# Patient Record
Sex: Female | Born: 1940 | Race: White | Hispanic: No | Marital: Married
Health system: Southern US, Community
[De-identification: ages and names within clinical notes are randomized; demographics above are authoritative.]

---

## 2004-04-02 ENCOUNTER — Ambulatory Visit: Payer: Self-pay | Admitting: Internal Medicine

## 2005-04-16 ENCOUNTER — Ambulatory Visit: Payer: Self-pay | Admitting: Internal Medicine

## 2005-06-29 ENCOUNTER — Ambulatory Visit: Payer: Self-pay | Admitting: Gastroenterology

## 2005-07-21 ENCOUNTER — Ambulatory Visit: Payer: Self-pay | Admitting: Ophthalmology

## 2006-06-03 ENCOUNTER — Ambulatory Visit: Payer: Self-pay | Admitting: Internal Medicine

## 2007-08-16 ENCOUNTER — Ambulatory Visit: Payer: Self-pay | Admitting: Internal Medicine

## 2007-08-18 ENCOUNTER — Ambulatory Visit: Payer: Self-pay | Admitting: Internal Medicine

## 2008-03-20 ENCOUNTER — Ambulatory Visit: Payer: Self-pay | Admitting: Internal Medicine

## 2008-04-03 ENCOUNTER — Ambulatory Visit: Payer: Self-pay | Admitting: Internal Medicine

## 2008-08-17 ENCOUNTER — Ambulatory Visit: Payer: Self-pay | Admitting: Internal Medicine

## 2008-10-08 ENCOUNTER — Ambulatory Visit: Payer: Self-pay | Admitting: Internal Medicine

## 2009-05-02 ENCOUNTER — Ambulatory Visit: Payer: Self-pay | Admitting: Internal Medicine

## 2009-06-24 ENCOUNTER — Ambulatory Visit: Payer: Self-pay | Admitting: Gastroenterology

## 2009-07-01 ENCOUNTER — Ambulatory Visit: Payer: Self-pay | Admitting: Gastroenterology

## 2009-07-24 ENCOUNTER — Ambulatory Visit: Payer: Self-pay | Admitting: Gastroenterology

## 2009-08-14 ENCOUNTER — Other Ambulatory Visit: Payer: Self-pay | Admitting: Gastroenterology

## 2009-08-22 ENCOUNTER — Ambulatory Visit: Payer: Self-pay | Admitting: Internal Medicine

## 2010-06-18 ENCOUNTER — Ambulatory Visit: Payer: Self-pay | Admitting: Ophthalmology

## 2010-07-07 ENCOUNTER — Ambulatory Visit: Payer: Self-pay | Admitting: Ophthalmology

## 2010-10-28 ENCOUNTER — Ambulatory Visit: Payer: Self-pay | Admitting: Internal Medicine

## 2011-07-17 ENCOUNTER — Ambulatory Visit: Payer: Self-pay | Admitting: Gastroenterology

## 2011-08-11 ENCOUNTER — Ambulatory Visit: Payer: Self-pay | Admitting: Gastroenterology

## 2011-11-03 ENCOUNTER — Ambulatory Visit: Payer: Self-pay | Admitting: Internal Medicine

## 2011-12-11 ENCOUNTER — Emergency Department: Payer: Self-pay | Admitting: Emergency Medicine

## 2011-12-11 LAB — BASIC METABOLIC PANEL
Anion Gap: 8 (ref 7–16)
BUN: 11 mg/dL (ref 7–18)
Calcium, Total: 9.3 mg/dL (ref 8.5–10.1)
Chloride: 102 mmol/L (ref 98–107)
Creatinine: 0.73 mg/dL (ref 0.60–1.30)
EGFR (African American): >60
EGFR (Non-African Amer.): >60
Osmolality: 275 (ref 275–301)
Sodium: 137 mmol/L (ref 136–145)

## 2011-12-11 LAB — TROPONIN I: Troponin-I: <0.02 ng/mL

## 2011-12-11 LAB — CBC
HCT: 34.2 % — ABNORMAL LOW (ref 35.0–47.0)
MCH: 26 pg (ref 26.0–34.0)
MCHC: 32.8 g/dL (ref 32.0–36.0)
RBC: 4.31 10*6/uL (ref 3.80–5.20)
WBC: 13.7 10*3/uL — ABNORMAL HIGH (ref 3.6–11.0)

## 2011-12-22 ENCOUNTER — Ambulatory Visit: Payer: Self-pay | Admitting: Internal Medicine

## 2011-12-24 ENCOUNTER — Emergency Department: Payer: Self-pay | Admitting: Emergency Medicine

## 2011-12-25 LAB — BASIC METABOLIC PANEL
Anion Gap: 7 (ref 7–16)
BUN: 11 mg/dL (ref 7–18)
Creatinine: 0.68 mg/dL (ref 0.60–1.30)
EGFR (African American): >60
EGFR (Non-African Amer.): >60
Osmolality: 277 (ref 275–301)

## 2011-12-25 LAB — HEPATIC FUNCTION PANEL A (ARMC)
Alkaline Phosphatase: 71 U/L (ref 50–136)
Bilirubin, Direct: <0.1 mg/dL (ref 0.00–0.20)
Bilirubin,Total: 0.2 mg/dL (ref 0.2–1.0)
SGOT(AST): 13 U/L — ABNORMAL LOW (ref 15–37)
SGPT (ALT): 17 U/L (ref 12–78)
Total Protein: 7 g/dL (ref 6.4–8.2)

## 2011-12-25 LAB — CBC
HCT: 31.2 % — ABNORMAL LOW (ref 35.0–47.0)
HGB: 10.1 g/dL — ABNORMAL LOW (ref 12.0–16.0)
MCV: 79 fL — ABNORMAL LOW (ref 80–100)
Platelet: 360 10*3/uL (ref 150–440)
RDW: 14.8 % — ABNORMAL HIGH (ref 11.5–14.5)
WBC: 9.8 10*3/uL (ref 3.6–11.0)

## 2011-12-30 ENCOUNTER — Ambulatory Visit: Payer: Self-pay | Admitting: Specialist

## 2012-01-06 ENCOUNTER — Ambulatory Visit: Payer: Self-pay | Admitting: Hematology and Oncology

## 2012-01-06 LAB — COMPREHENSIVE METABOLIC PANEL
Anion Gap: 8 (ref 7–16)
Bilirubin,Total: 0.3 mg/dL (ref 0.2–1.0)
Calcium, Total: 9.2 mg/dL (ref 8.5–10.1)
Chloride: 103 mmol/L (ref 98–107)
Co2: 28 mmol/L (ref 21–32)
Creatinine: 0.66 mg/dL (ref 0.60–1.30)
EGFR (African American): >60
EGFR (Non-African Amer.): >60
Potassium: 4.2 mmol/L (ref 3.5–5.1)
SGOT(AST): 16 U/L (ref 15–37)
SGPT (ALT): 15 U/L (ref 12–78)

## 2012-01-06 LAB — CBC CANCER CENTER
Basophil %: 0.3 %
Eosinophil %: 0.3 %
HCT: 33.2 % — ABNORMAL LOW (ref 35.0–47.0)
HGB: 10.7 g/dL — ABNORMAL LOW (ref 12.0–16.0)
Lymphocyte %: 8.5 %
MCH: 25.1 pg — ABNORMAL LOW (ref 26.0–34.0)
MCHC: 32.2 g/dL (ref 32.0–36.0)
Monocyte #: 0.6 x10 3/mm (ref 0.2–0.9)
Neutrophil #: 9.1 x10 3/mm — ABNORMAL HIGH (ref 1.4–6.5)
Neutrophil %: 85.3 %
Platelet: 516 x10 3/mm — ABNORMAL HIGH (ref 150–440)
RDW: 14.6 % — ABNORMAL HIGH (ref 11.5–14.5)

## 2012-01-06 LAB — APTT: Activated PTT: 33 secs (ref 23.6–35.9)

## 2012-01-15 ENCOUNTER — Ambulatory Visit: Payer: Self-pay | Admitting: Hematology and Oncology

## 2012-01-21 LAB — COMPREHENSIVE METABOLIC PANEL
Albumin: 2.7 g/dL — ABNORMAL LOW (ref 3.4–5.0)
Alkaline Phosphatase: 75 U/L (ref 50–136)
Anion Gap: 8 (ref 7–16)
Bilirubin,Total: 0.4 mg/dL (ref 0.2–1.0)
Calcium, Total: 9.7 mg/dL (ref 8.5–10.1)
Co2: 28 mmol/L (ref 21–32)
Glucose: 134 mg/dL — ABNORMAL HIGH (ref 65–99)
Osmolality: 268 (ref 275–301)
SGPT (ALT): 24 U/L (ref 12–78)
Sodium: 133 mmol/L — ABNORMAL LOW (ref 136–145)
Total Protein: 7.1 g/dL (ref 6.4–8.2)

## 2012-01-21 LAB — CBC CANCER CENTER
Basophil #: 0.1 x10 3/mm (ref 0.0–0.1)
Basophil %: 0.6 %
Eosinophil #: 0 x10 3/mm (ref 0.0–0.7)
Eosinophil %: 0.1 %
HGB: 10.4 g/dL — ABNORMAL LOW (ref 12.0–16.0)
Lymphocyte %: 7.5 %
MCH: 24.1 pg — ABNORMAL LOW (ref 26.0–34.0)
MCHC: 31.6 g/dL — ABNORMAL LOW (ref 32.0–36.0)
MCV: 76 fL — ABNORMAL LOW (ref 80–100)
Monocyte %: 8.2 %
Neutrophil %: 83.6 %
Platelet: 513 x10 3/mm — ABNORMAL HIGH (ref 150–440)
RBC: 4.32 10*6/uL (ref 3.80–5.20)
RDW: 15.3 % — ABNORMAL HIGH (ref 11.5–14.5)

## 2012-01-28 LAB — COMPREHENSIVE METABOLIC PANEL
Alkaline Phosphatase: 79 U/L (ref 50–136)
Bilirubin,Total: 0.5 mg/dL (ref 0.2–1.0)
Chloride: 90 mmol/L — ABNORMAL LOW (ref 98–107)
Co2: 22 mmol/L (ref 21–32)
Creatinine: 0.91 mg/dL (ref 0.60–1.30)
EGFR (African American): >60
EGFR (Non-African Amer.): >60
Glucose: 116 mg/dL — ABNORMAL HIGH (ref 65–99)
Potassium: 4.6 mmol/L (ref 3.5–5.1)
SGOT(AST): 24 U/L (ref 15–37)
SGPT (ALT): 47 U/L (ref 12–78)

## 2012-01-28 LAB — CBC CANCER CENTER
Basophil %: 0.3 %
Eosinophil #: 0 x10 3/mm (ref 0.0–0.7)
HCT: 33.7 % — ABNORMAL LOW (ref 35.0–47.0)
HGB: 10.4 g/dL — ABNORMAL LOW (ref 12.0–16.0)
Lymphocyte %: 17.9 %
MCHC: 30.9 g/dL — ABNORMAL LOW (ref 32.0–36.0)
MCV: 77 fL — ABNORMAL LOW (ref 80–100)
Monocyte #: 0.5 x10 3/mm (ref 0.2–0.9)
Monocyte %: 29.6 %
Neutrophil %: 50.2 %
RBC: 4.4 10*6/uL (ref 3.80–5.20)
RDW: 15.2 % — ABNORMAL HIGH (ref 11.5–14.5)
WBC: 1.6 x10 3/mm — CL (ref 3.6–11.0)

## 2012-01-29 ENCOUNTER — Ambulatory Visit: Payer: Self-pay | Admitting: Hematology and Oncology

## 2012-01-30 LAB — CBC
HGB: 10.2 g/dL — ABNORMAL LOW (ref 12.0–16.0)
MCH: 24.3 pg — ABNORMAL LOW (ref 26.0–34.0)
MCHC: 32.2 g/dL (ref 32.0–36.0)
MCV: 76 fL — ABNORMAL LOW (ref 80–100)
RDW: 15 % — ABNORMAL HIGH (ref 11.5–14.5)
WBC: 16.2 10*3/uL — ABNORMAL HIGH (ref 3.6–11.0)

## 2012-01-30 LAB — CK TOTAL AND CKMB (NOT AT ARMC)
CK, Total: 25 U/L (ref 21–215)
CK-MB: <0.5 ng/mL — ABNORMAL LOW (ref 0.5–3.6)

## 2012-01-30 LAB — COMPREHENSIVE METABOLIC PANEL
Anion Gap: 12 (ref 7–16)
BUN: 18 mg/dL (ref 7–18)
Bilirubin,Total: 0.5 mg/dL (ref 0.2–1.0)
Calcium, Total: 8.6 mg/dL (ref 8.5–10.1)
Chloride: 96 mmol/L — ABNORMAL LOW (ref 98–107)
Creatinine: 0.9 mg/dL (ref 0.60–1.30)
EGFR (African American): >60
Glucose: 160 mg/dL — ABNORMAL HIGH (ref 65–99)
Potassium: 4.3 mmol/L (ref 3.5–5.1)
SGPT (ALT): 28 U/L (ref 12–78)
Total Protein: 6.2 g/dL — ABNORMAL LOW (ref 6.4–8.2)

## 2012-01-31 ENCOUNTER — Inpatient Hospital Stay: Payer: Self-pay | Admitting: Internal Medicine

## 2012-01-31 LAB — URINALYSIS, COMPLETE
Bilirubin,UR: NEGATIVE
Glucose,UR: 50 mg/dL (ref 0–75)
Ph: 5 (ref 4.5–8.0)
RBC,UR: 2 /HPF (ref 0–5)
Specific Gravity: 1.025 (ref 1.003–1.030)
Squamous Epithelial: 3

## 2012-02-01 ENCOUNTER — Ambulatory Visit: Payer: Self-pay | Admitting: Hematology and Oncology

## 2012-02-01 LAB — CBC WITH DIFFERENTIAL/PLATELET
Bands: 6 %
Comment - H1-Com4: NORMAL
HGB: 7.8 g/dL — ABNORMAL LOW (ref 12.0–16.0)
Lymphocytes: 6 %
MCH: 24.7 pg — ABNORMAL LOW (ref 26.0–34.0)
MCV: 76 fL — ABNORMAL LOW (ref 80–100)
Metamyelocyte: 1 %
Monocytes: 5 %
Platelet: 200 10*3/uL (ref 150–440)
Segmented Neutrophils: 81 %

## 2012-02-01 LAB — BASIC METABOLIC PANEL
Anion Gap: 10 (ref 7–16)
BUN: 9 mg/dL (ref 7–18)
Chloride: 102 mmol/L (ref 98–107)
Creatinine: 0.73 mg/dL (ref 0.60–1.30)
Potassium: 4 mmol/L (ref 3.5–5.1)
Sodium: 134 mmol/L — ABNORMAL LOW (ref 136–145)

## 2012-02-02 LAB — STOOL CULTURE

## 2012-02-04 LAB — CBC CANCER CENTER
Basophil #: 0 x10 3/mm (ref 0.0–0.1)
Eosinophil #: 0 x10 3/mm (ref 0.0–0.7)
HCT: 28.5 % — ABNORMAL LOW (ref 35.0–47.0)
Lymphocyte #: 0.6 x10 3/mm — ABNORMAL LOW (ref 1.0–3.6)
Lymphocyte %: 4.3 %
MCH: 24.1 pg — ABNORMAL LOW (ref 26.0–34.0)
MCHC: 30.9 g/dL — ABNORMAL LOW (ref 32.0–36.0)
Monocyte #: 0.6 x10 3/mm (ref 0.2–0.9)
Monocyte %: 4.2 %
Neutrophil #: 12.3 x10 3/mm — ABNORMAL HIGH (ref 1.4–6.5)
Neutrophil %: 91.4 %
Platelet: 249 x10 3/mm (ref 150–440)
RDW: 15.7 % — ABNORMAL HIGH (ref 11.5–14.5)
WBC: 13.5 x10 3/mm — ABNORMAL HIGH (ref 3.6–11.0)

## 2012-02-04 LAB — COMPREHENSIVE METABOLIC PANEL
Alkaline Phosphatase: 93 U/L (ref 50–136)
Anion Gap: 8 (ref 7–16)
BUN: 8 mg/dL (ref 7–18)
Bilirubin,Total: 0.3 mg/dL (ref 0.2–1.0)
Chloride: 104 mmol/L (ref 98–107)
Co2: 28 mmol/L (ref 21–32)
Creatinine: 0.63 mg/dL (ref 0.60–1.30)
EGFR (Non-African Amer.): >60
Glucose: 112 mg/dL — ABNORMAL HIGH (ref 65–99)
SGOT(AST): 22 U/L (ref 15–37)
SGPT (ALT): 25 U/L (ref 12–78)
Total Protein: 5.6 g/dL — ABNORMAL LOW (ref 6.4–8.2)

## 2012-02-05 LAB — CULTURE, BLOOD (SINGLE)

## 2012-02-05 LAB — PATHOLOGY REPORT

## 2012-02-11 LAB — CBC CANCER CENTER
Basophil #: 0 x10 3/mm (ref 0.0–0.1)
Eosinophil #: 0 x10 3/mm (ref 0.0–0.7)
HCT: 31.4 % — ABNORMAL LOW (ref 35.0–47.0)
Lymphocyte #: 0.3 x10 3/mm — ABNORMAL LOW (ref 1.0–3.6)
MCH: 24.7 pg — ABNORMAL LOW (ref 26.0–34.0)
MCHC: 31 g/dL — ABNORMAL LOW (ref 32.0–36.0)
MCV: 80 fL (ref 80–100)
Monocyte %: 7.7 %
Neutrophil #: 5.1 x10 3/mm (ref 1.4–6.5)
RBC: 3.94 10*6/uL (ref 3.80–5.20)
RDW: 16.8 % — ABNORMAL HIGH (ref 11.5–14.5)
WBC: 5.9 x10 3/mm (ref 3.6–11.0)

## 2012-02-11 LAB — COMPREHENSIVE METABOLIC PANEL
Albumin: 2.7 g/dL — ABNORMAL LOW (ref 3.4–5.0)
Alkaline Phosphatase: 76 U/L (ref 50–136)
Anion Gap: 9 (ref 7–16)
Calcium, Total: 9.2 mg/dL (ref 8.5–10.1)
Co2: 26 mmol/L (ref 21–32)
EGFR (Non-African Amer.): >60
Glucose: 172 mg/dL — ABNORMAL HIGH (ref 65–99)
Osmolality: 277 (ref 275–301)
Potassium: 4 mmol/L (ref 3.5–5.1)
SGOT(AST): 12 U/L — ABNORMAL LOW (ref 15–37)

## 2012-02-18 ENCOUNTER — Emergency Department: Payer: Self-pay | Admitting: Emergency Medicine

## 2012-02-18 LAB — URINALYSIS, COMPLETE
Granular Cast: 7
Hyaline Cast: 3
Leukocyte Esterase: NEGATIVE
Ph: 5 (ref 4.5–8.0)
Protein: 100
RBC,UR: 4 /HPF (ref 0–5)
Specific Gravity: 1.02 (ref 1.003–1.030)
Squamous Epithelial: 4
WBC UR: 13 /HPF (ref 0–5)

## 2012-02-18 LAB — CBC
HCT: 27.3 % — ABNORMAL LOW (ref 35.0–47.0)
MCV: 77 fL — ABNORMAL LOW (ref 80–100)
Platelet: 92 10*3/uL — ABNORMAL LOW (ref 150–440)
RDW: 17.7 % — ABNORMAL HIGH (ref 11.5–14.5)
WBC: 0.9 10*3/uL — CL (ref 3.6–11.0)

## 2012-02-18 LAB — DIFFERENTIAL
Bands: 2 %
Lymphocytes: 6 %
Segmented Neutrophils: 15 %

## 2012-02-18 LAB — COMPREHENSIVE METABOLIC PANEL
Albumin: 2.6 g/dL — ABNORMAL LOW (ref 3.4–5.0)
Alkaline Phosphatase: 82 U/L (ref 50–136)
Anion Gap: 8 (ref 7–16)
BUN: 14 mg/dL (ref 7–18)
Calcium, Total: 8.2 mg/dL — ABNORMAL LOW (ref 8.5–10.1)
Co2: 24 mmol/L (ref 21–32)
Creatinine: 0.56 mg/dL — ABNORMAL LOW (ref 0.60–1.30)
EGFR (African American): >60
EGFR (Non-African Amer.): >60
Glucose: 90 mg/dL (ref 65–99)
Potassium: 4.1 mmol/L (ref 3.5–5.1)
SGOT(AST): 19 U/L (ref 15–37)

## 2012-02-18 LAB — PHOSPHORUS: Phosphorus: 2.5 mg/dL (ref 2.5–4.9)

## 2012-02-18 LAB — MAGNESIUM: Magnesium: 1.3 mg/dL — ABNORMAL LOW

## 2012-02-23 LAB — CBC CANCER CENTER
Basophil #: 0 x10 3/mm (ref 0.0–0.1)
Eosinophil #: 0 x10 3/mm (ref 0.0–0.7)
HGB: 9 g/dL — ABNORMAL LOW (ref 12.0–16.0)
Lymphocyte #: 0.6 x10 3/mm — ABNORMAL LOW (ref 1.0–3.6)
MCH: 25.3 pg — ABNORMAL LOW (ref 26.0–34.0)
Neutrophil #: 3.4 x10 3/mm (ref 1.4–6.5)
Neutrophil %: 70.8 %
Platelet: 147 x10 3/mm — ABNORMAL LOW (ref 150–440)
RBC: 3.54 10*6/uL — ABNORMAL LOW (ref 3.80–5.20)
WBC: 4.8 x10 3/mm (ref 3.6–11.0)

## 2012-02-23 LAB — COMPREHENSIVE METABOLIC PANEL
BUN: 11 mg/dL (ref 7–18)
Calcium, Total: 7.8 mg/dL — ABNORMAL LOW (ref 8.5–10.1)
Chloride: 94 mmol/L — ABNORMAL LOW (ref 98–107)
Co2: 25 mmol/L (ref 21–32)
Creatinine: 0.71 mg/dL (ref 0.60–1.30)
EGFR (African American): >60
EGFR (Non-African Amer.): >60
Osmolality: 264 (ref 275–301)
Potassium: 3.8 mmol/L (ref 3.5–5.1)
SGOT(AST): 12 U/L — ABNORMAL LOW (ref 15–37)
SGPT (ALT): 24 U/L (ref 12–78)

## 2012-02-23 LAB — MAGNESIUM: Magnesium: 2 mg/dL

## 2012-02-24 LAB — CULTURE, BLOOD (SINGLE)

## 2012-02-28 ENCOUNTER — Ambulatory Visit: Payer: Self-pay | Admitting: Hematology and Oncology

## 2012-02-29 ENCOUNTER — Ambulatory Visit: Payer: Self-pay | Admitting: Specialist

## 2012-03-02 ENCOUNTER — Inpatient Hospital Stay: Payer: Self-pay | Admitting: Hematology and Oncology

## 2012-03-02 LAB — BASIC METABOLIC PANEL
Calcium, Total: 8.2 mg/dL — ABNORMAL LOW (ref 8.5–10.1)
Chloride: 96 mmol/L — ABNORMAL LOW (ref 98–107)
Creatinine: 0.85 mg/dL (ref 0.60–1.30)
EGFR (African American): >60
EGFR (Non-African Amer.): >60
Glucose: 105 mg/dL — ABNORMAL HIGH (ref 65–99)
Osmolality: 269 (ref 275–301)
Potassium: 4.4 mmol/L (ref 3.5–5.1)

## 2012-03-02 LAB — CBC CANCER CENTER
Basophil %: 0.3 %
Eosinophil #: 0 x10 3/mm (ref 0.0–0.7)
Eosinophil %: 0.1 %
HGB: 9.3 g/dL — ABNORMAL LOW (ref 12.0–16.0)
Lymphocyte %: 12.3 %
MCH: 25.4 pg — ABNORMAL LOW (ref 26.0–34.0)
MCHC: 33 g/dL (ref 32.0–36.0)
Monocyte #: 1.1 x10 3/mm — ABNORMAL HIGH (ref 0.2–0.9)
Monocyte %: 18 %
Neutrophil %: 69.3 %
Platelet: 780 x10 3/mm — ABNORMAL HIGH (ref 150–440)
RBC: 3.65 10*6/uL — ABNORMAL LOW (ref 3.80–5.20)
RDW: 18.5 % — ABNORMAL HIGH (ref 11.5–14.5)
WBC: 6.1 x10 3/mm (ref 3.6–11.0)

## 2012-03-03 LAB — CBC WITH DIFFERENTIAL/PLATELET
Bands: 3 %
HCT: 22.8 % — ABNORMAL LOW (ref 35.0–47.0)
HGB: 7.3 g/dL — ABNORMAL LOW (ref 12.0–16.0)
MCH: 24.7 pg — ABNORMAL LOW (ref 26.0–34.0)
MCHC: 32.1 g/dL (ref 32.0–36.0)
MCV: 77 fL — ABNORMAL LOW (ref 80–100)
Metamyelocyte: 6 %
Platelet: 523 10*3/uL — ABNORMAL HIGH (ref 150–440)
RDW: 18.6 % — ABNORMAL HIGH (ref 11.5–14.5)
Segmented Neutrophils: 74 %

## 2012-03-03 LAB — BASIC METABOLIC PANEL
BUN: 9 mg/dL (ref 7–18)
EGFR (African American): >60
Glucose: 117 mg/dL — ABNORMAL HIGH (ref 65–99)
Potassium: 3.9 mmol/L (ref 3.5–5.1)

## 2012-03-03 LAB — CREATININE, URINE, 24 HOUR: Creatinine, 24 Hr Urine: 499 mg/24hr — ABNORMAL LOW (ref 600–1000)

## 2012-03-03 LAB — MAGNESIUM: Magnesium: 1.9 mg/dL

## 2012-03-03 LAB — TRIGLYCERIDES: Triglycerides: 68 mg/dL (ref 0–200)

## 2012-03-04 LAB — CALCIUM: Calcium, Total: 7.8 mg/dL — ABNORMAL LOW (ref 8.5–10.1)

## 2012-03-04 LAB — POTASSIUM: Potassium: 4.1 mmol/L (ref 3.5–5.1)

## 2012-03-04 LAB — PHOSPHORUS: Phosphorus: 2.5 mg/dL (ref 2.5–4.9)

## 2012-03-04 LAB — MAGNESIUM: Magnesium: 1.9 mg/dL

## 2012-03-04 LAB — SODIUM: Sodium: 136 mmol/L (ref 136–145)

## 2012-03-05 LAB — COMPREHENSIVE METABOLIC PANEL
Albumin: 2.1 g/dL — ABNORMAL LOW (ref 3.4–5.0)
Alkaline Phosphatase: 94 U/L (ref 50–136)
BUN: 6 mg/dL — ABNORMAL LOW (ref 7–18)
Chloride: 106 mmol/L (ref 98–107)
Co2: 24 mmol/L (ref 21–32)
Creatinine: 0.51 mg/dL — ABNORMAL LOW (ref 0.60–1.30)
EGFR (Non-African Amer.): >60
Potassium: 4.7 mmol/L (ref 3.5–5.1)
SGOT(AST): 12 U/L — ABNORMAL LOW (ref 15–37)
SGPT (ALT): 10 U/L — ABNORMAL LOW (ref 12–78)
Sodium: 137 mmol/L (ref 136–145)
Total Protein: 5.9 g/dL — ABNORMAL LOW (ref 6.4–8.2)

## 2012-03-05 LAB — CBC WITH DIFFERENTIAL/PLATELET
Basophil #: 0 10*3/uL (ref 0.0–0.1)
Eosinophil #: 0 10*3/uL (ref 0.0–0.7)
HCT: 27.8 % — ABNORMAL LOW (ref 35.0–47.0)
Lymphocyte #: 0.4 10*3/uL — ABNORMAL LOW (ref 1.0–3.6)
Lymphocyte %: 6.3 %
MCHC: 32.2 g/dL (ref 32.0–36.0)
MCV: 79 fL — ABNORMAL LOW (ref 80–100)
Metamyelocyte: 2 %
Myelocyte: 1 %
Neutrophil #: 5.8 10*3/uL (ref 1.4–6.5)
Platelet: 486 10*3/uL — ABNORMAL HIGH (ref 150–440)
RBC: 3.52 10*6/uL — ABNORMAL LOW (ref 3.80–5.20)
RDW: 18.8 % — ABNORMAL HIGH (ref 11.5–14.5)

## 2012-03-06 LAB — CALCIUM: Calcium, Total: 7.6 mg/dL — ABNORMAL LOW

## 2012-03-06 LAB — MAGNESIUM: Magnesium: 1.8 mg/dL

## 2012-03-07 LAB — OCCULT BLOOD X 1 CARD TO LAB, STOOL: Occult Blood, Feces: NEGATIVE

## 2012-03-08 LAB — COMPREHENSIVE METABOLIC PANEL
Albumin: 2 g/dL — ABNORMAL LOW (ref 3.4–5.0)
Alkaline Phosphatase: 112 U/L (ref 50–136)
Anion Gap: 6 — ABNORMAL LOW (ref 7–16)
BUN: 8 mg/dL (ref 7–18)
Calcium, Total: 7.7 mg/dL — ABNORMAL LOW (ref 8.5–10.1)
EGFR (African American): >60
Glucose: 111 mg/dL — ABNORMAL HIGH (ref 65–99)
SGOT(AST): 87 U/L — ABNORMAL HIGH (ref 15–37)
SGPT (ALT): 43 U/L (ref 12–78)
Total Protein: 4.9 g/dL — ABNORMAL LOW (ref 6.4–8.2)

## 2012-03-08 LAB — CBC WITH DIFFERENTIAL/PLATELET
Basophil %: 0.1 %
Eosinophil %: 0.5 %
HCT: 21.7 % — ABNORMAL LOW (ref 35.0–47.0)
HGB: 7.1 g/dL — ABNORMAL LOW (ref 12.0–16.0)
Lymphocyte #: 0.6 10*3/uL — ABNORMAL LOW (ref 1.0–3.6)
Lymphocyte %: 1.2 %
MCHC: 33 g/dL (ref 32.0–36.0)
Monocyte #: 0.5 x10 3/mm (ref 0.2–0.9)
Monocyte %: 1 %
Neutrophil %: 97.2 %
Platelet: 316 10*3/uL (ref 150–440)
RBC: 2.76 10*6/uL — ABNORMAL LOW (ref 3.80–5.20)

## 2012-03-09 LAB — MAGNESIUM: Magnesium: 1.6 mg/dL — ABNORMAL LOW

## 2012-03-09 LAB — CALCIUM: Calcium, Total: 7.9 mg/dL — ABNORMAL LOW (ref 8.5–10.1)

## 2012-03-17 LAB — BASIC METABOLIC PANEL
Calcium, Total: 7.9 mg/dL — ABNORMAL LOW (ref 8.5–10.1)
Chloride: 99 mmol/L (ref 98–107)
Creatinine: 1.02 mg/dL (ref 0.60–1.30)
EGFR (African American): >60
EGFR (Non-African Amer.): 55 — ABNORMAL LOW
Osmolality: 273 (ref 275–301)
Potassium: 2.9 mmol/L — ABNORMAL LOW (ref 3.5–5.1)

## 2012-03-17 LAB — CBC CANCER CENTER
Basophil %: 0.1 %
Eosinophil #: 0 x10 3/mm (ref 0.0–0.7)
HCT: 29.6 % — ABNORMAL LOW (ref 35.0–47.0)
HGB: 9.6 g/dL — ABNORMAL LOW (ref 12.0–16.0)
Lymphocyte %: 7.1 %
MCHC: 32.5 g/dL (ref 32.0–36.0)
Monocyte %: 11.1 %
Neutrophil #: 15.2 x10 3/mm — ABNORMAL HIGH (ref 1.4–6.5)
Neutrophil %: 81.7 %
RBC: 3.75 10*6/uL — ABNORMAL LOW (ref 3.80–5.20)
WBC: 18.7 x10 3/mm — ABNORMAL HIGH (ref 3.6–11.0)

## 2012-03-25 LAB — CBC CANCER CENTER
Basophil %: 0.1 %
Eosinophil %: 0.1 %
HCT: 32.1 % — ABNORMAL LOW (ref 35.0–47.0)
HGB: 10.2 g/dL — ABNORMAL LOW (ref 12.0–16.0)
Lymphocyte #: 0.9 x10 3/mm — ABNORMAL LOW (ref 1.0–3.6)
MCH: 25.6 pg — ABNORMAL LOW (ref 26.0–34.0)
MCHC: 31.8 g/dL — ABNORMAL LOW (ref 32.0–36.0)
MCV: 80 fL (ref 80–100)
Monocyte #: 1.4 x10 3/mm — ABNORMAL HIGH (ref 0.2–0.9)
Neutrophil #: 11.5 x10 3/mm — ABNORMAL HIGH (ref 1.4–6.5)
Platelet: 442 x10 3/mm — ABNORMAL HIGH (ref 150–440)
RBC: 3.99 10*6/uL (ref 3.80–5.20)
RDW: 22.9 % — ABNORMAL HIGH (ref 11.5–14.5)
WBC: 13.8 x10 3/mm — ABNORMAL HIGH (ref 3.6–11.0)

## 2012-03-25 LAB — COMPREHENSIVE METABOLIC PANEL
Alkaline Phosphatase: 102 U/L (ref 50–136)
Anion Gap: 12 (ref 7–16)
Bilirubin,Total: 0.3 mg/dL (ref 0.2–1.0)
Creatinine: 1.36 mg/dL — ABNORMAL HIGH (ref 0.60–1.30)
EGFR (African American): 45 — ABNORMAL LOW
EGFR (Non-African Amer.): 39 — ABNORMAL LOW
Potassium: 3.8 mmol/L (ref 3.5–5.1)
SGOT(AST): 15 U/L (ref 15–37)
SGPT (ALT): 13 U/L (ref 12–78)
Sodium: 134 mmol/L — ABNORMAL LOW (ref 136–145)
Total Protein: 6.3 g/dL — ABNORMAL LOW (ref 6.4–8.2)

## 2012-03-30 ENCOUNTER — Ambulatory Visit: Payer: Self-pay | Admitting: Hematology and Oncology

## 2012-04-06 LAB — BASIC METABOLIC PANEL
Anion Gap: 8 (ref 7–16)
Calcium, Total: 7.7 mg/dL — ABNORMAL LOW (ref 8.5–10.1)
EGFR (African American): >60
EGFR (Non-African Amer.): >60
Glucose: 86 mg/dL (ref 65–99)
Osmolality: 279 (ref 275–301)
Potassium: 3.3 mmol/L — ABNORMAL LOW (ref 3.5–5.1)

## 2012-04-06 LAB — CBC CANCER CENTER
Eosinophil #: 0 x10 3/mm (ref 0.0–0.7)
Eosinophil %: 0.4 %
Lymphocyte %: 10.8 %
MCHC: 31.3 g/dL — ABNORMAL LOW (ref 32.0–36.0)
Monocyte %: 8.8 %
Neutrophil #: 9 x10 3/mm — ABNORMAL HIGH (ref 1.4–6.5)
Neutrophil %: 79.8 %
Platelet: 96 x10 3/mm — ABNORMAL LOW (ref 150–440)
WBC: 11.3 x10 3/mm — ABNORMAL HIGH (ref 3.6–11.0)

## 2012-04-07 ENCOUNTER — Observation Stay: Payer: Self-pay | Admitting: Oncology

## 2012-04-20 LAB — CBC CANCER CENTER
Basophil #: 0.1 x10 3/mm (ref 0.0–0.1)
Eosinophil #: 0.1 x10 3/mm (ref 0.0–0.7)
HGB: 11.7 g/dL — ABNORMAL LOW (ref 12.0–16.0)
Lymphocyte #: 1 x10 3/mm (ref 1.0–3.6)
Lymphocyte %: 8.9 %
MCH: 28.4 pg (ref 26.0–34.0)
Monocyte #: 1.2 x10 3/mm — ABNORMAL HIGH (ref 0.2–0.9)
Monocyte %: 10.4 %
Neutrophil #: 8.8 x10 3/mm — ABNORMAL HIGH (ref 1.4–6.5)
Platelet: 255 x10 3/mm (ref 150–440)
RBC: 4.13 10*6/uL (ref 3.80–5.20)
RDW: 20.4 % — ABNORMAL HIGH (ref 11.5–14.5)
WBC: 11.1 x10 3/mm — ABNORMAL HIGH (ref 3.6–11.0)

## 2012-04-20 LAB — BASIC METABOLIC PANEL
BUN: 13 mg/dL (ref 7–18)
EGFR (African American): >60
Osmolality: 279 (ref 275–301)
Potassium: 4.4 mmol/L (ref 3.5–5.1)

## 2012-04-30 ENCOUNTER — Ambulatory Visit: Payer: Self-pay | Admitting: Hematology and Oncology

## 2012-05-24 LAB — CBC CANCER CENTER
Basophil %: 0.3 %
Eosinophil #: 0.1 x10 3/mm (ref 0.0–0.7)
HCT: 36 % (ref 35.0–47.0)
HGB: 11.8 g/dL — ABNORMAL LOW (ref 12.0–16.0)
Lymphocyte #: 0.8 x10 3/mm — ABNORMAL LOW (ref 1.0–3.6)
MCHC: 32.8 g/dL (ref 32.0–36.0)
Neutrophil #: 4.7 x10 3/mm (ref 1.4–6.5)
WBC: 6.1 x10 3/mm (ref 3.6–11.0)

## 2012-05-24 LAB — BASIC METABOLIC PANEL
Anion Gap: 9 (ref 7–16)
Creatinine: 0.9 mg/dL (ref 0.60–1.30)
Glucose: 119 mg/dL — ABNORMAL HIGH (ref 65–99)
Osmolality: 278 (ref 275–301)
Sodium: 139 mmol/L (ref 136–145)

## 2012-05-28 ENCOUNTER — Ambulatory Visit: Payer: Self-pay | Admitting: Hematology and Oncology

## 2012-06-21 LAB — BASIC METABOLIC PANEL
BUN: 13 mg/dL (ref 7–18)
Calcium, Total: 8.4 mg/dL — ABNORMAL LOW (ref 8.5–10.1)
Chloride: 102 mmol/L (ref 98–107)
EGFR (African American): >60
EGFR (Non-African Amer.): >60
Glucose: 93 mg/dL (ref 65–99)
Osmolality: 275 (ref 275–301)
Sodium: 138 mmol/L (ref 136–145)

## 2012-06-28 ENCOUNTER — Ambulatory Visit: Payer: Self-pay | Admitting: Hematology and Oncology

## 2012-06-30 ENCOUNTER — Ambulatory Visit: Payer: Self-pay | Admitting: Hematology and Oncology

## 2012-07-04 LAB — CBC CANCER CENTER
Basophil #: 0 x10 3/mm (ref 0.0–0.1)
Basophil %: 0.3 %
Eosinophil #: 0 x10 3/mm (ref 0.0–0.7)
Lymphocyte #: 0.8 x10 3/mm — ABNORMAL LOW (ref 1.0–3.6)
Lymphocyte %: 5.1 %
MCH: 27.7 pg (ref 26.0–34.0)
MCHC: 31.8 g/dL — ABNORMAL LOW (ref 32.0–36.0)
MCV: 87 fL (ref 80–100)
Monocyte #: 0.8 x10 3/mm (ref 0.2–0.9)
Monocyte %: 5.5 %
Neutrophil #: 13.1 x10 3/mm — ABNORMAL HIGH (ref 1.4–6.5)
Neutrophil %: 88.9 %
Platelet: 294 x10 3/mm (ref 150–440)
RBC: 3.73 10*6/uL — ABNORMAL LOW (ref 3.80–5.20)

## 2012-07-04 LAB — COMPREHENSIVE METABOLIC PANEL
Albumin: 2.8 g/dL — ABNORMAL LOW (ref 3.4–5.0)
Alkaline Phosphatase: 57 U/L (ref 50–136)
Anion Gap: 9 (ref 7–16)
Bilirubin,Total: 0.4 mg/dL (ref 0.2–1.0)
Co2: 27 mmol/L (ref 21–32)
Creatinine: 0.85 mg/dL (ref 0.60–1.30)
EGFR (African American): >60
Glucose: 140 mg/dL — ABNORMAL HIGH (ref 65–99)
SGPT (ALT): 12 U/L (ref 12–78)
Sodium: 136 mmol/L (ref 136–145)

## 2012-07-06 ENCOUNTER — Ambulatory Visit: Payer: Self-pay | Admitting: Internal Medicine

## 2012-07-10 ENCOUNTER — Inpatient Hospital Stay: Payer: Self-pay | Admitting: Internal Medicine

## 2012-07-10 LAB — COMPREHENSIVE METABOLIC PANEL
Albumin: 3.5 g/dL (ref 3.4–5.0)
Anion Gap: 6 — ABNORMAL LOW (ref 7–16)
BUN: 13 mg/dL (ref 7–18)
Bilirubin,Total: 0.3 mg/dL (ref 0.2–1.0)
Calcium, Total: 9.9 mg/dL (ref 8.5–10.1)
Co2: 27 mmol/L (ref 21–32)
Osmolality: 272 (ref 275–301)
Potassium: 3.2 mmol/L — ABNORMAL LOW (ref 3.5–5.1)
SGOT(AST): 16 U/L (ref 15–37)
SGPT (ALT): 13 U/L (ref 12–78)
Sodium: 136 mmol/L (ref 136–145)

## 2012-07-10 LAB — CBC
HCT: 35.3 % (ref 35.0–47.0)
MCH: 28.2 pg (ref 26.0–34.0)
MCHC: 32.6 g/dL (ref 32.0–36.0)
Platelet: 387 10*3/uL (ref 150–440)
RDW: 14 % (ref 11.5–14.5)
WBC: 8 10*3/uL (ref 3.6–11.0)

## 2012-07-10 LAB — PROTIME-INR
INR: 1
Prothrombin Time: 13.2 secs (ref 11.5–14.7)

## 2012-07-10 LAB — CK TOTAL AND CKMB (NOT AT ARMC): CK-MB: <0.5 ng/mL — ABNORMAL LOW (ref 0.5–3.6)

## 2012-07-10 LAB — TROPONIN I: Troponin-I: <0.02 ng/mL

## 2012-07-11 LAB — CBC WITH DIFFERENTIAL/PLATELET
Basophil #: 0 10*3/uL (ref 0.0–0.1)
Eosinophil %: 0 %
HCT: 30.5 % — ABNORMAL LOW (ref 35.0–47.0)
Lymphocyte #: 0.5 10*3/uL — ABNORMAL LOW (ref 1.0–3.6)
Lymphocyte %: 8 %
MCHC: 33.3 g/dL (ref 32.0–36.0)
Monocyte %: 1.4 %

## 2012-07-11 LAB — BASIC METABOLIC PANEL
BUN: 9 mg/dL (ref 7–18)
Chloride: 105 mmol/L (ref 98–107)
Creatinine: 0.6 mg/dL (ref 0.60–1.30)
EGFR (African American): >60
EGFR (Non-African Amer.): >60
Glucose: 152 mg/dL — ABNORMAL HIGH (ref 65–99)
Potassium: 4.2 mmol/L (ref 3.5–5.1)
Sodium: 136 mmol/L (ref 136–145)

## 2012-07-11 LAB — PHENYTOIN LEVEL, TOTAL: Dilantin: 18.2 ug/mL (ref 10.0–20.0)

## 2012-07-21 LAB — CBC CANCER CENTER
Eosinophil #: 0 x10 3/mm (ref 0.0–0.7)
Eosinophil %: 0.2 %
HCT: 33.4 % — ABNORMAL LOW (ref 35.0–47.0)
Lymphocyte #: 1.3 x10 3/mm (ref 1.0–3.6)
MCHC: 32.2 g/dL (ref 32.0–36.0)
Monocyte #: 1.2 x10 3/mm — ABNORMAL HIGH (ref 0.2–0.9)
Monocyte %: 7.2 %
Platelet: 311 x10 3/mm (ref 150–440)
RDW: 14.3 % (ref 11.5–14.5)
WBC: 16.1 x10 3/mm — ABNORMAL HIGH (ref 3.6–11.0)

## 2012-07-21 LAB — BASIC METABOLIC PANEL
Anion Gap: 8 (ref 7–16)
BUN: 22 mg/dL — ABNORMAL HIGH (ref 7–18)
Calcium, Total: 9 mg/dL (ref 8.5–10.1)
Creatinine: 1.01 mg/dL (ref 0.60–1.30)
EGFR (Non-African Amer.): 55 — ABNORMAL LOW
Osmolality: 269 (ref 275–301)
Potassium: 5 mmol/L (ref 3.5–5.1)
Sodium: 132 mmol/L — ABNORMAL LOW (ref 136–145)

## 2012-07-28 ENCOUNTER — Ambulatory Visit: Payer: Self-pay | Admitting: Hematology and Oncology

## 2012-08-01 LAB — BASIC METABOLIC PANEL
BUN: 12 mg/dL (ref 7–18)
Chloride: 98 mmol/L (ref 98–107)
Co2: 27 mmol/L (ref 21–32)
Creatinine: 0.7 mg/dL (ref 0.60–1.30)
EGFR (Non-African Amer.): >60
Glucose: 117 mg/dL — ABNORMAL HIGH (ref 65–99)
Osmolality: 267 (ref 275–301)
Potassium: 4.3 mmol/L (ref 3.5–5.1)
Sodium: 133 mmol/L — ABNORMAL LOW (ref 136–145)

## 2012-08-01 LAB — CBC CANCER CENTER
Basophil %: 0.3 %
Eosinophil #: 0.1 x10 3/mm (ref 0.0–0.7)
Eosinophil %: 0.5 %
HCT: 32.3 % — ABNORMAL LOW (ref 35.0–47.0)
Lymphocyte #: 0.6 x10 3/mm — ABNORMAL LOW (ref 1.0–3.6)
Lymphocyte %: 5.7 %
MCHC: 31.7 g/dL — ABNORMAL LOW (ref 32.0–36.0)
MCV: 85 fL (ref 80–100)
Monocyte #: 1 x10 3/mm — ABNORMAL HIGH (ref 0.2–0.9)
Neutrophil %: 84.6 %
RDW: 14.5 % (ref 11.5–14.5)

## 2012-08-05 ENCOUNTER — Inpatient Hospital Stay: Payer: Self-pay | Admitting: Hematology and Oncology

## 2012-08-05 LAB — COMPREHENSIVE METABOLIC PANEL
Albumin: 2.9 g/dL — ABNORMAL LOW (ref 3.4–5.0)
Anion Gap: 7 (ref 7–16)
Bilirubin,Total: 0.3 mg/dL (ref 0.2–1.0)
Chloride: 99 mmol/L (ref 98–107)
Co2: 27 mmol/L (ref 21–32)
Creatinine: 0.62 mg/dL (ref 0.60–1.30)
EGFR (African American): >60
EGFR (Non-African Amer.): >60
Osmolality: 267 (ref 275–301)
SGOT(AST): 16 U/L (ref 15–37)
SGPT (ALT): 25 U/L (ref 12–78)
Sodium: 133 mmol/L — ABNORMAL LOW (ref 136–145)
Total Protein: 7.6 g/dL (ref 6.4–8.2)

## 2012-08-05 LAB — CBC WITH DIFFERENTIAL/PLATELET
Eosinophil #: 0 10*3/uL (ref 0.0–0.7)
Eosinophil %: 0.3 %
HCT: 34.9 % — ABNORMAL LOW (ref 35.0–47.0)
HGB: 11.7 g/dL — ABNORMAL LOW (ref 12.0–16.0)
MCH: 28.1 pg (ref 26.0–34.0)
MCHC: 33.5 g/dL (ref 32.0–36.0)
MCV: 84 fL (ref 80–100)
Monocyte %: 6.8 %
Neutrophil #: 12.7 10*3/uL — ABNORMAL HIGH (ref 1.4–6.5)
Neutrophil %: 88.5 %
RDW: 14.7 % — ABNORMAL HIGH (ref 11.5–14.5)
WBC: 14.4 10*3/uL — ABNORMAL HIGH (ref 3.6–11.0)

## 2012-08-06 LAB — BASIC METABOLIC PANEL
Anion Gap: 5 — ABNORMAL LOW (ref 7–16)
Calcium, Total: 8.2 mg/dL — ABNORMAL LOW (ref 8.5–10.1)
Co2: 27 mmol/L (ref 21–32)
Creatinine: 0.5 mg/dL — ABNORMAL LOW (ref 0.60–1.30)
EGFR (African American): >60
Osmolality: 270 (ref 275–301)
Sodium: 136 mmol/L (ref 136–145)

## 2012-08-06 LAB — CBC WITH DIFFERENTIAL/PLATELET
Basophil %: 0.3 %
Eosinophil %: 0.6 %
HCT: 27.5 % — ABNORMAL LOW (ref 35.0–47.0)
HGB: 9.2 g/dL — ABNORMAL LOW (ref 12.0–16.0)
Lymphocyte #: 0.6 10*3/uL — ABNORMAL LOW (ref 1.0–3.6)
MCH: 28.2 pg (ref 26.0–34.0)
MCV: 84 fL (ref 80–100)
Monocyte %: 10 %
RBC: 3.27 10*6/uL — ABNORMAL LOW (ref 3.80–5.20)
RDW: 14.6 % — ABNORMAL HIGH (ref 11.5–14.5)

## 2012-08-08 LAB — URINALYSIS, COMPLETE
Bilirubin,UR: NEGATIVE
Glucose,UR: NEGATIVE mg/dL (ref 0–75)
Ph: 6 (ref 4.5–8.0)
Protein: 30
Specific Gravity: 1.025 (ref 1.003–1.030)

## 2012-08-08 LAB — BASIC METABOLIC PANEL
Anion Gap: 9 (ref 7–16)
Chloride: 97 mmol/L — ABNORMAL LOW (ref 98–107)
Co2: 27 mmol/L (ref 21–32)
Creatinine: 0.7 mg/dL (ref 0.60–1.30)
EGFR (African American): >60
EGFR (Non-African Amer.): >60
Osmolality: 266 (ref 275–301)

## 2012-08-08 LAB — CBC CANCER CENTER
Basophil #: 0 x10 3/mm (ref 0.0–0.1)
Basophil %: 0.1 %
Eosinophil %: 0.2 %
HCT: 32.8 % — ABNORMAL LOW (ref 35.0–47.0)
HGB: 10.6 g/dL — ABNORMAL LOW (ref 12.0–16.0)
Lymphocyte %: 5.4 %
MCH: 27.3 pg (ref 26.0–34.0)
MCV: 85 fL (ref 80–100)
Monocyte #: 1.1 x10 3/mm — ABNORMAL HIGH (ref 0.2–0.9)
Monocyte %: 9.2 %
Neutrophil #: 9.9 x10 3/mm — ABNORMAL HIGH (ref 1.4–6.5)
Neutrophil %: 85.1 %
Platelet: 303 x10 3/mm (ref 150–440)
RBC: 3.87 10*6/uL (ref 3.80–5.20)
WBC: 11.6 x10 3/mm — ABNORMAL HIGH (ref 3.6–11.0)

## 2012-08-10 ENCOUNTER — Inpatient Hospital Stay: Payer: Self-pay | Admitting: Hematology and Oncology

## 2012-08-11 LAB — CBC WITH DIFFERENTIAL/PLATELET
Basophil %: 0.2 %
HGB: 9.8 g/dL — ABNORMAL LOW (ref 12.0–16.0)
Lymphocyte #: 0.6 10*3/uL — ABNORMAL LOW (ref 1.0–3.6)
Lymphocyte %: 5.5 %
MCH: 27.9 pg (ref 26.0–34.0)
MCV: 85 fL (ref 80–100)
Monocyte #: 1.1 x10 3/mm — ABNORMAL HIGH (ref 0.2–0.9)
Neutrophil %: 83.5 %
Platelet: 296 10*3/uL (ref 150–440)
RDW: 15.2 % — ABNORMAL HIGH (ref 11.5–14.5)
WBC: 11 10*3/uL (ref 3.6–11.0)

## 2012-08-11 LAB — BASIC METABOLIC PANEL
Anion Gap: 9 (ref 7–16)
Calcium, Total: 8.1 mg/dL — ABNORMAL LOW (ref 8.5–10.1)
Co2: 24 mmol/L (ref 21–32)
Creatinine: 0.62 mg/dL (ref 0.60–1.30)
EGFR (African American): >60
EGFR (Non-African Amer.): >60
Glucose: 75 mg/dL (ref 65–99)
Osmolality: 266 (ref 275–301)
Potassium: 3.5 mmol/L (ref 3.5–5.1)

## 2012-08-16 LAB — CULTURE, BLOOD (SINGLE)

## 2012-08-28 ENCOUNTER — Ambulatory Visit: Payer: Self-pay | Admitting: Hematology and Oncology

## 2012-09-27 DEATH — deceased

## 2013-11-09 IMAGING — CT CT OUTSIDE FILMS BODY
1 series · 16 of 32 positions shown, 20 images · non-contrast
Comparison: none

[Series 2: soft tissue (id) x (id) · axial · 0.98mm/px · z∈[-232,+138]mm · 16 of 165 slices shown, 20 images]
[im 11/165  soft-tissue]
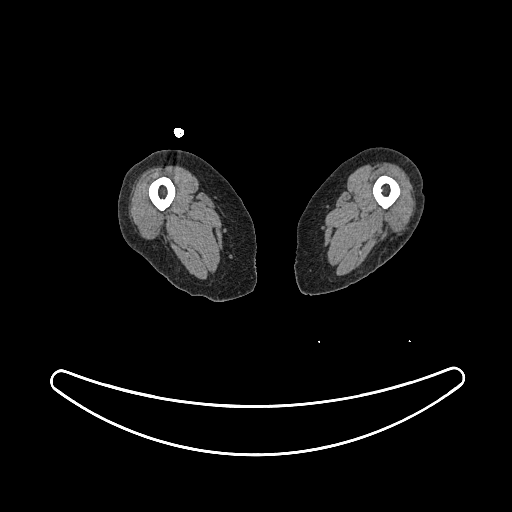
[im 11/165  bone]
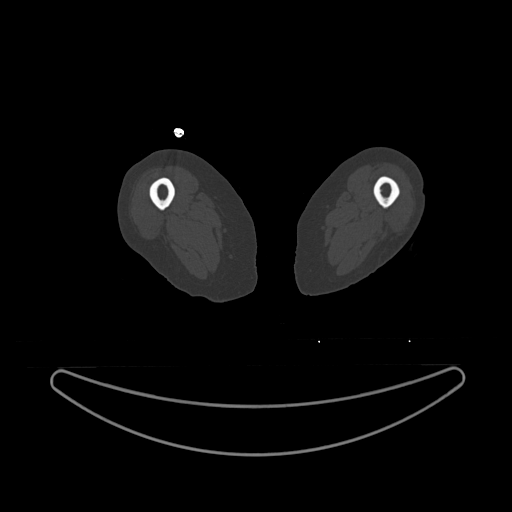
[im 22/165  soft-tissue]
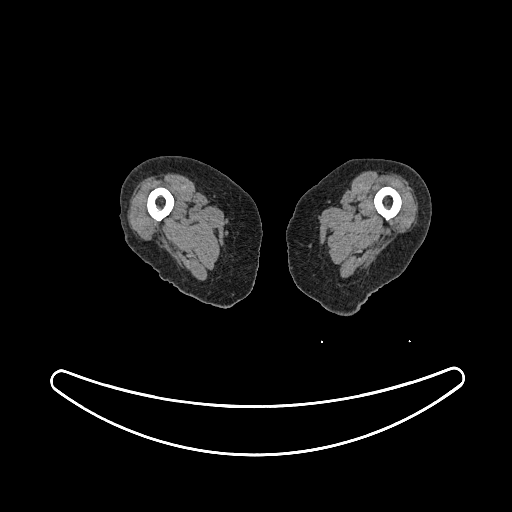
[im 32/165  soft-tissue]
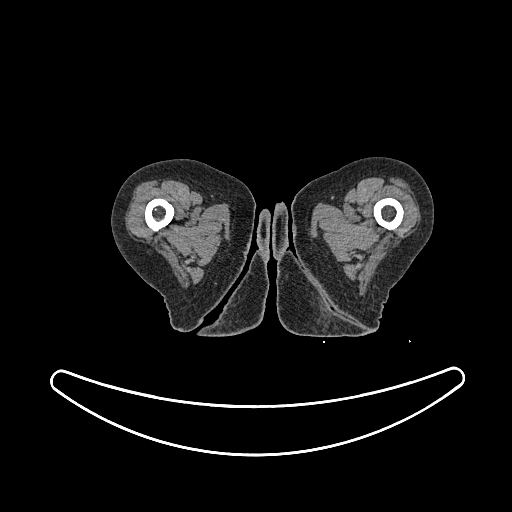
[im 43/165  soft-tissue]
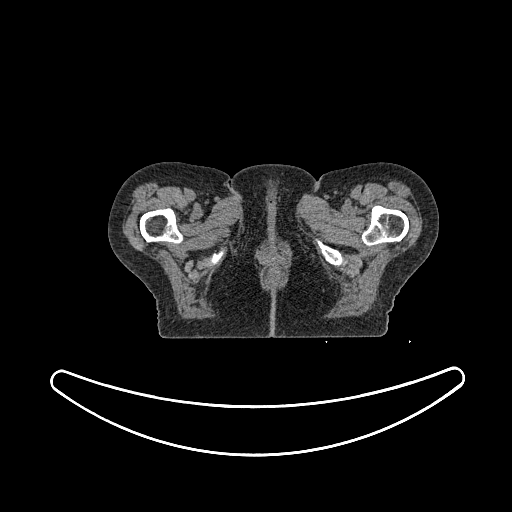
[im 53/165  soft-tissue]
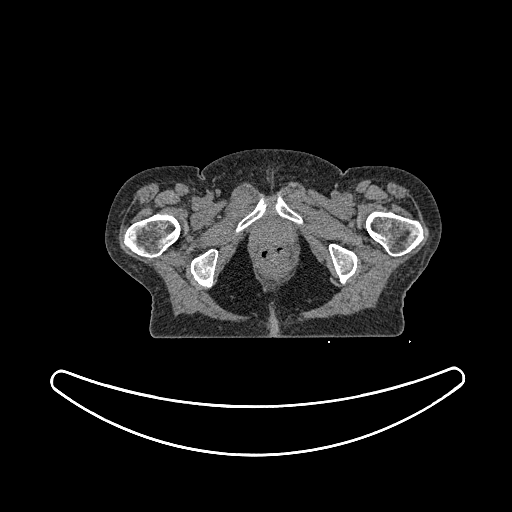
[im 64/165  soft-tissue]
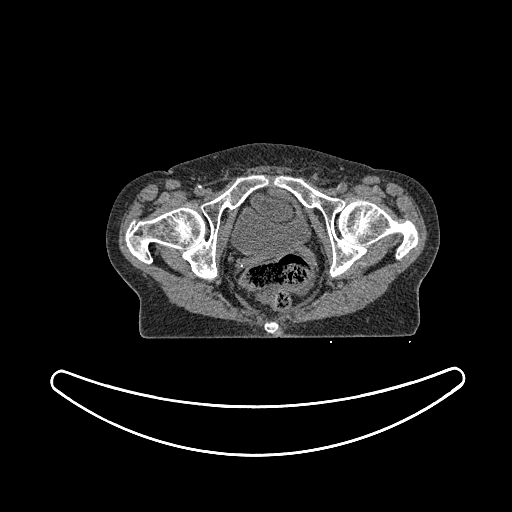
[im 75/165  soft-tissue]
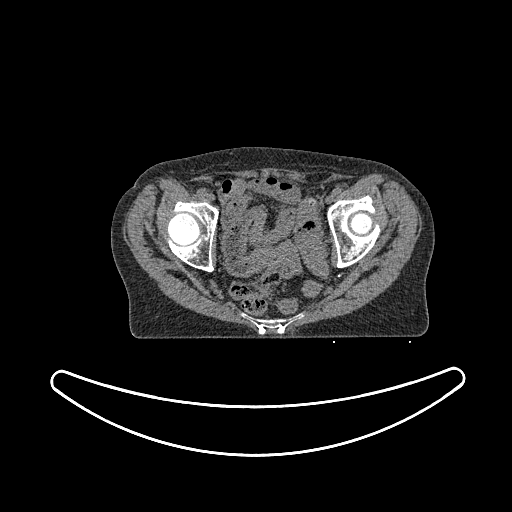
[im 90/165  soft-tissue]
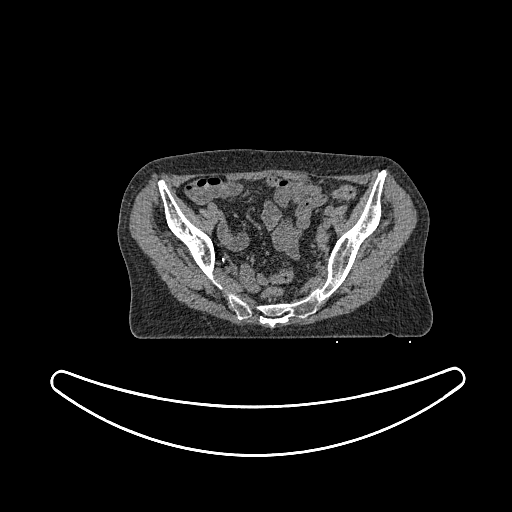
[im 101/165  soft-tissue]
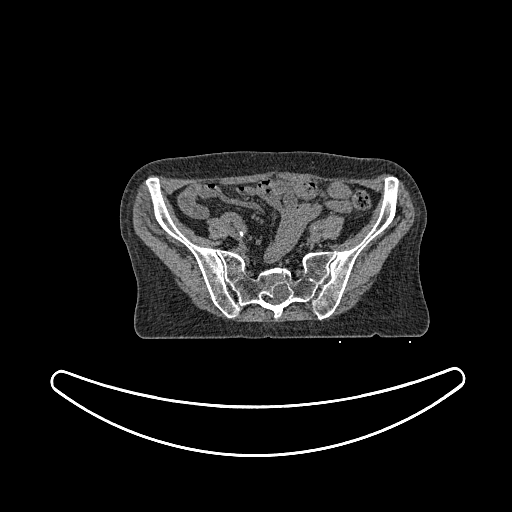
[im 101/165  bone]
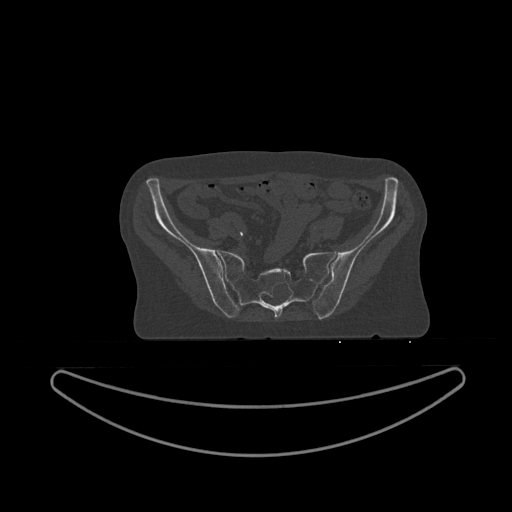
[im 112/165  soft-tissue]
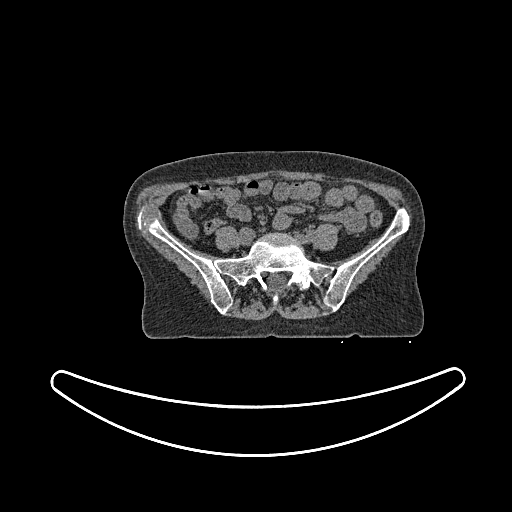
[im 122/165  soft-tissue]
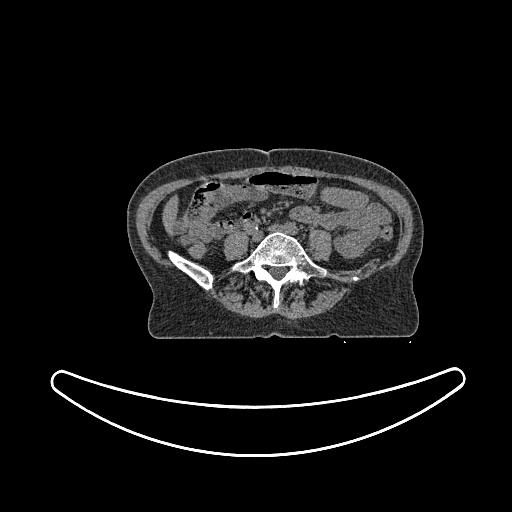
[im 133/165  soft-tissue]
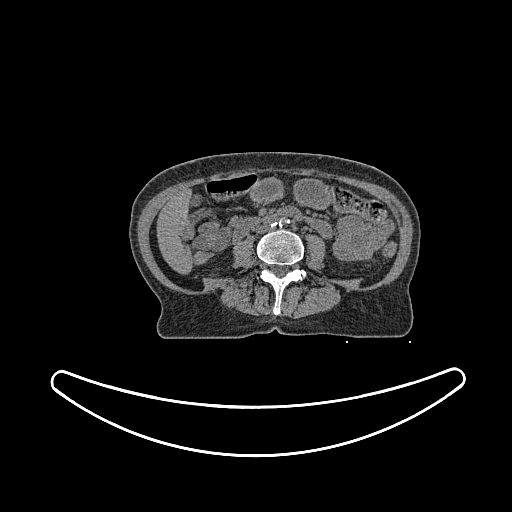
[im 143/165  soft-tissue]
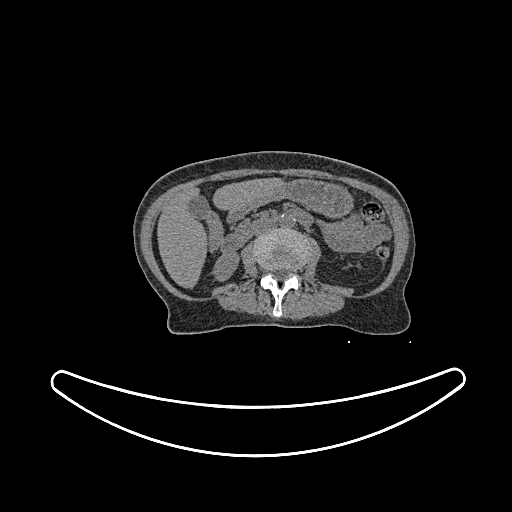
[im 143/165  lung]
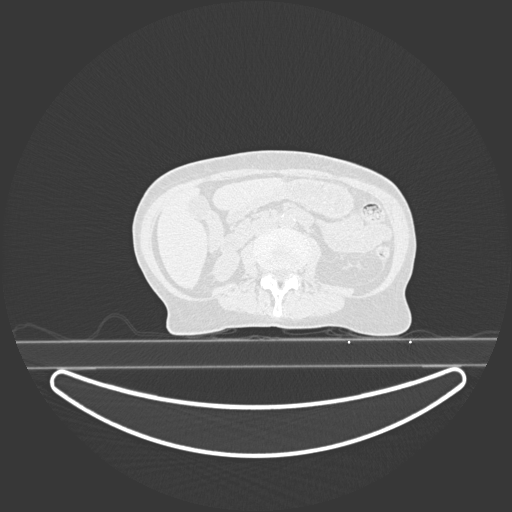
[im 149/165  lung]
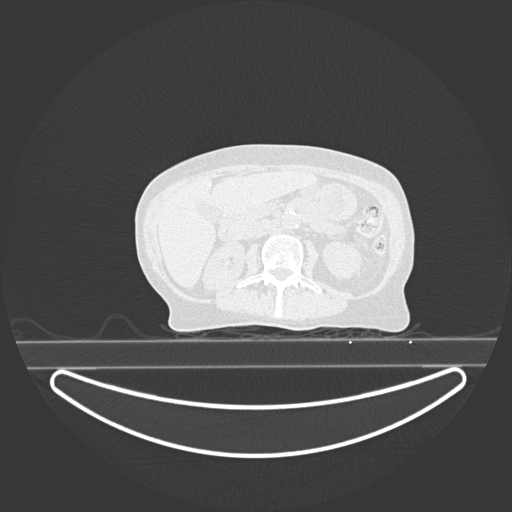
[im 154/165  soft-tissue]
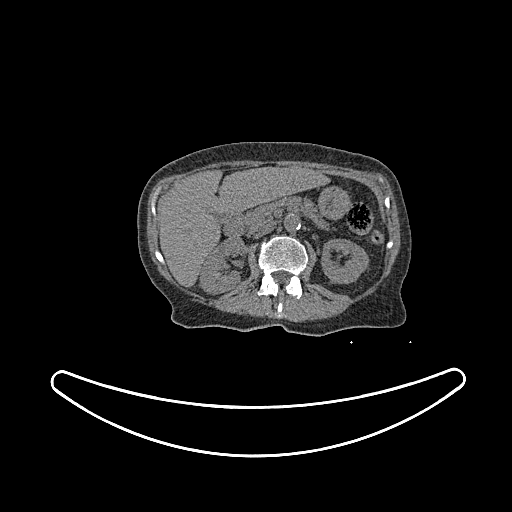
[im 154/165  lung]
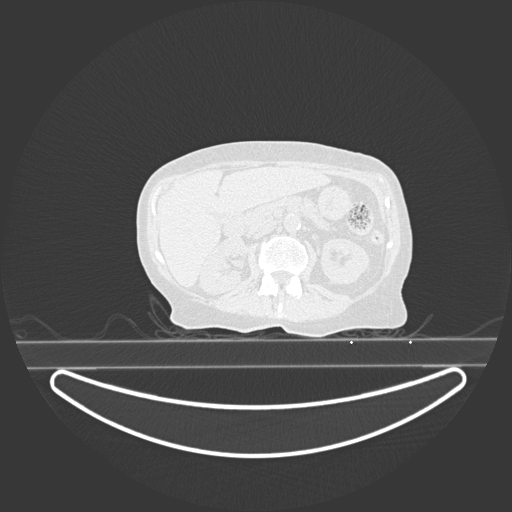
[im 159/165  lung]
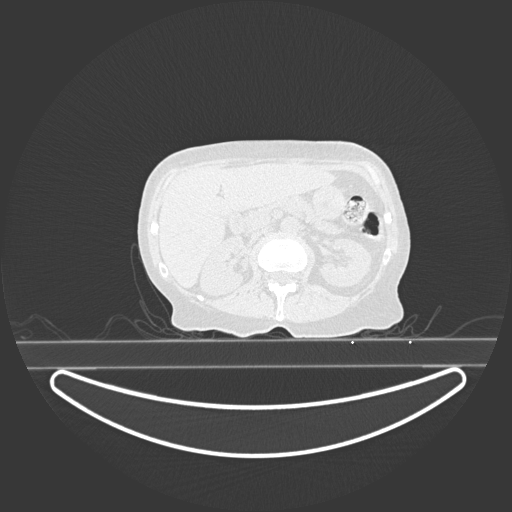

[16 of 32 positions shown; findings below may reference images not displayed]

**** An original report or order could not be provided from the [HOSPITAL] [facility] ****

## 2014-04-10 IMAGING — CT CT CHEST W/O CM
1 series · 15 of 33 positions shown, 19 images · non-contrast
Comparison: none

REASON FOR EXAM: lung CA  access response after 6cycles of chemo
COMMENTS:

PROCEDURE:     CT  - CT CHEST WITHOUT CONTRAST  - April 20, 2012  [DATE]
RESULT:     History: Lung cancer.
Comparison Study: Prior CT of 02/29/2012 and 12/22/2011

[Series 2: soft tissue · axial · 0.55mm/px · z∈[-53,+223]mm · 15 of 108 slices shown, 19 images]
[im 8/108  mediastinal]
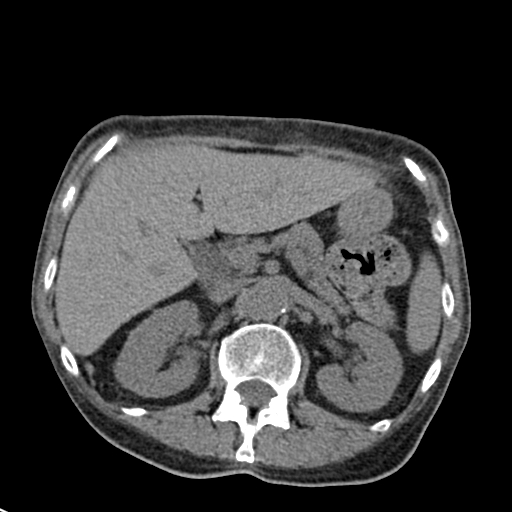
[im 8/108  lung]
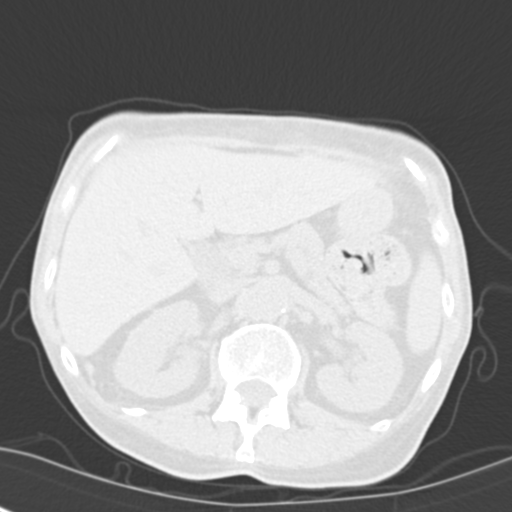
[im 16/108  lung]
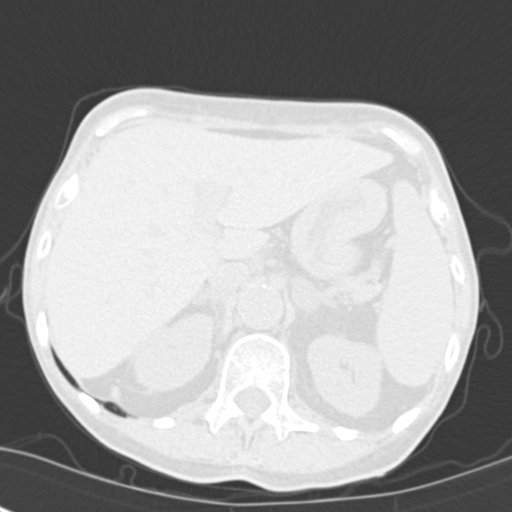
[im 22/108  lung]
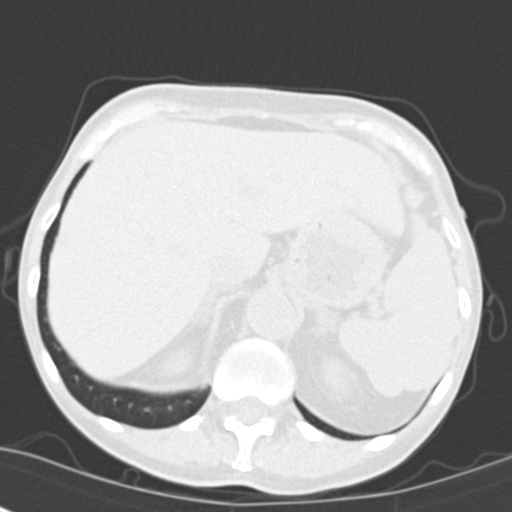
[im 28/108  lung]
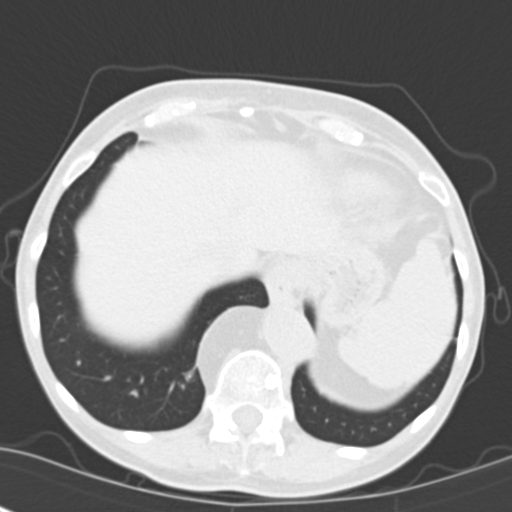
[im 36/108  mediastinal]
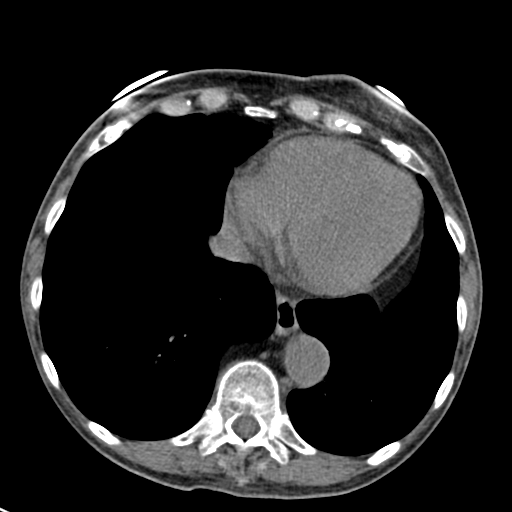
[im 36/108  lung]
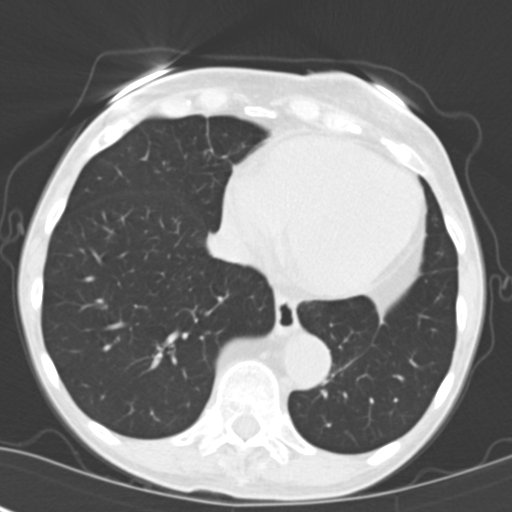
[im 43/108  lung]
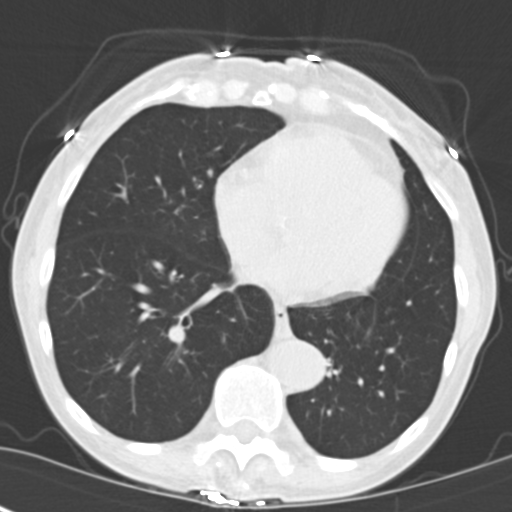
[im 48/108  lung]
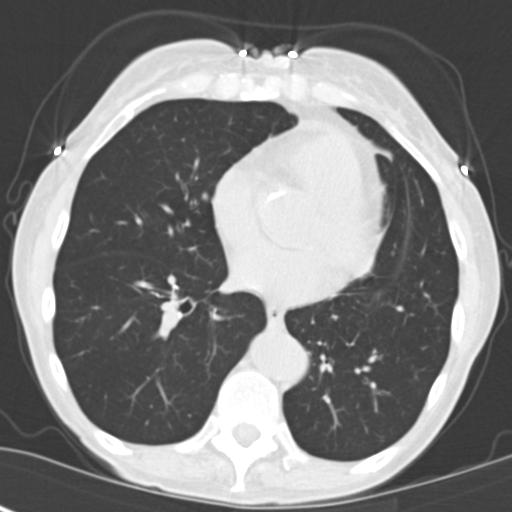
[im 56/108  lung]
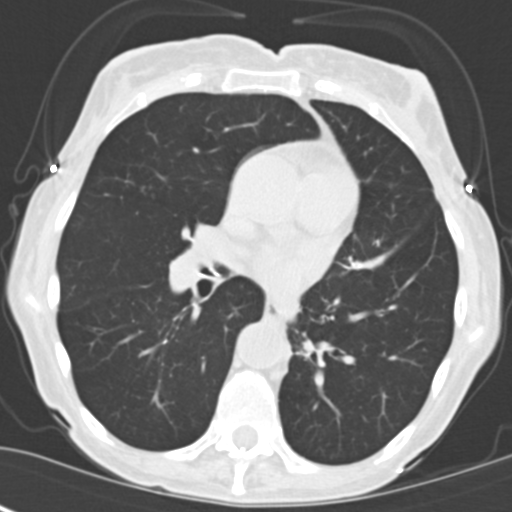
[im 60/108  mediastinal]
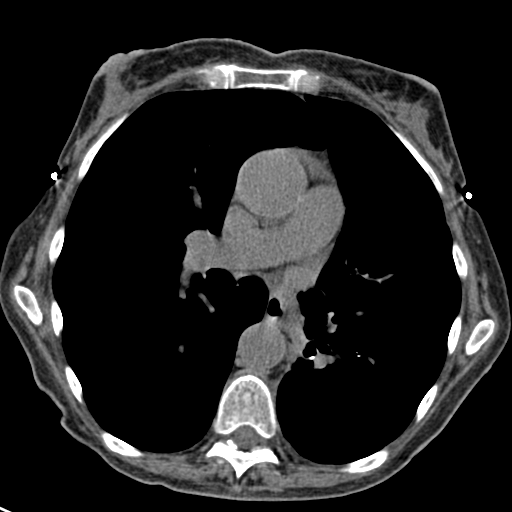
[im 60/108  lung]
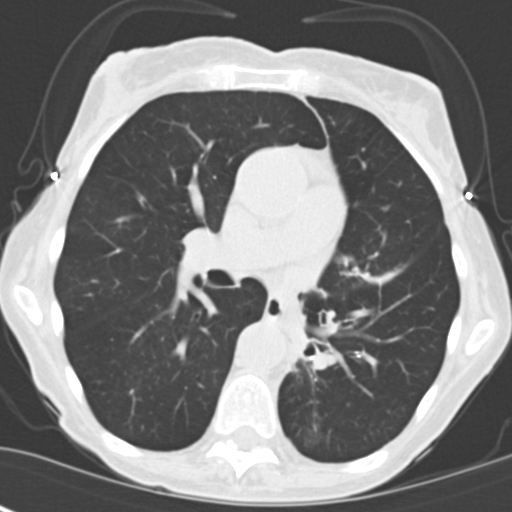
[im 65/108  lung]
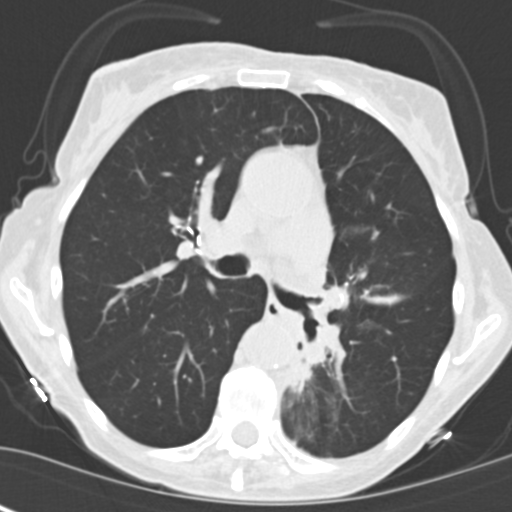
[im 72/108  lung]
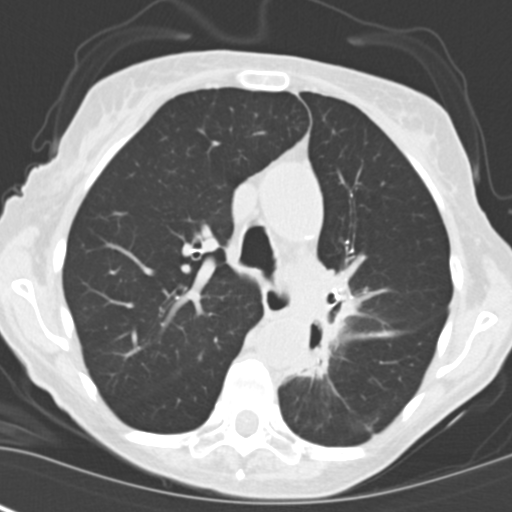
[im 80/108  lung]
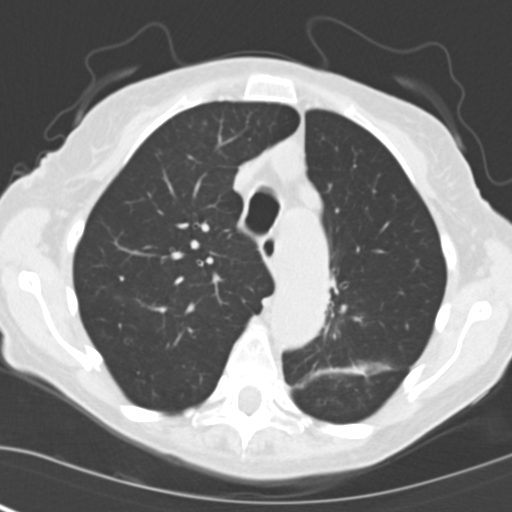
[im 86/108  mediastinal]
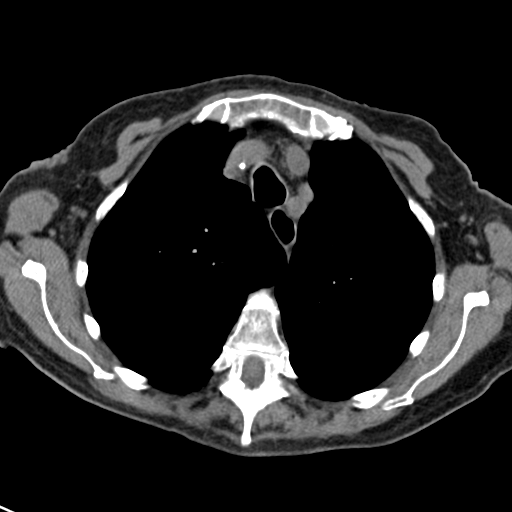
[im 86/108  lung]
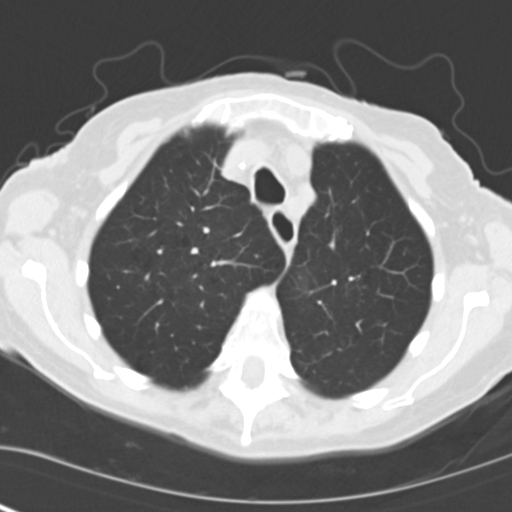
[im 92/108  lung]
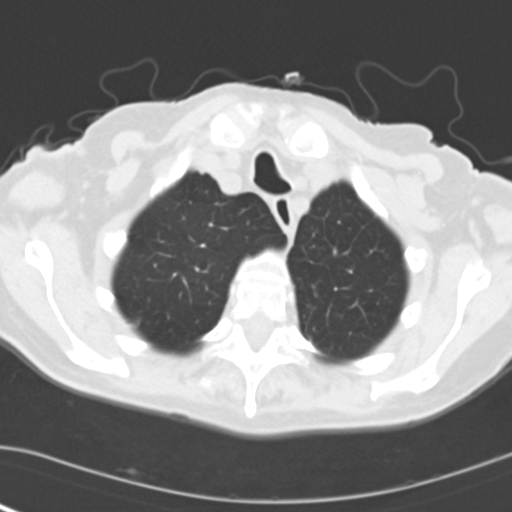
[im 100/108  lung]
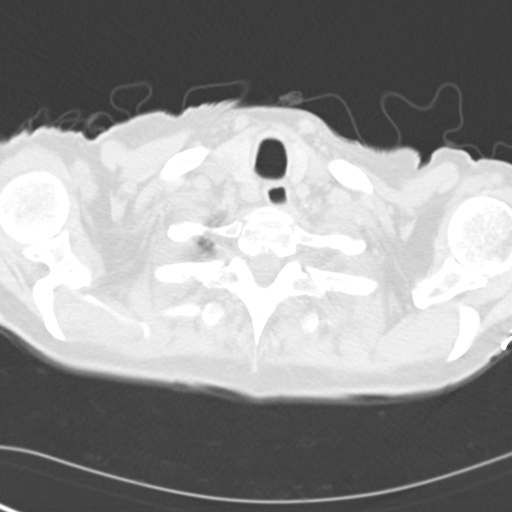

[15 of 33 positions shown; findings below may reference images not displayed]

FINDINGS: Tiny peripheral pulmonary nodule in the right upper lobe seen best
on image #41 is stable. There is a probable pulmonary nodule in the
periphery of the a right lung . This most likely not a vessel. Left upper
lobe cavitary the mass continues to regress in size consistent with
successful response to therapy. No evidence of tumor progression.

Mediastinum is stable with small nonspecific mediastinal lymph nodes. Heart
size is stable. Stable fullness in the left adrenal gland is noted. Stable
osseous structures.
IMPRESSION: 1. Continued regression of left upper lobe cavitary malignancy consistent
with response to therapy.
2. Stable tiny peripheral nodule in the right upper lobe with a probable
nodule present in the right mid upper lobe. These may be a few tiny
metastatic nodules but but are nonspecific and can be followed also for CTs.

## 2014-07-17 NOTE — H&P (Signed)
PATIENT NAME:  Summer Ellis, Summer Ellis MR#:  [id number] DATE OF BIRTH:  1940/12/29  DATE OF ADMISSION:  01/31/2012  PRIMARY CARE PHYSICIAN:  Dr. Yates Decamp, III.  ONCOLOGIST: Dr. Wendie Simmer.   CHIEF COMPLAINT: Diarrhea, fevers, chills, weakness.   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 The patient was recently diagnosed with metastatic lung carcinoma and started chemotherapy on 10/25. She is status post one session of chemotherapy and two sessions of radiation as of this week. Since initiation of chemotherapy, she has had diarrhea which is a known side effect of her carbo/Taxotere chemotherapy regimen. She reports having at least 10 bowel movements daily that are watery green and foul-smelling. Nonbloody, nonmucousy in consistency. She denies abdominal pain. She denies dysuria. On the day of presentation she developed fevers and chills, did not take temperature and also had generalized weakness due to these. She also had decreased p.o. intake over the last week. Due to these symptoms, she presented to the Emergency Department where she was found to be tachycardic, hypotensive with systolics in the 80s, heart rate in the 130s with some leukocytosis concerning for sepsis, so we are called for admission.   Review of records notes that the patient was last evaluated by her oncologist on 10/31 by her oncologists and MRI of brain note was addended at 5:00 p.m. to show brain metastases in the carotid space on the right with possible encroachment on the 8th nerve. The patient was scheduled to follow up with her oncologist on Tuesday, 11/05.   In the Emergency Department, the patient has received 2 liters of normal saline. She has been started on cefepime and vancomycin.   PAST MEDICAL HISTORY:  1. 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  2. Asthma.  3. Hyperlipidemia.  4. Hypertension.  5. Gastroesophageal reflux disease.  6. The patient had recent bout of oral thrush.   PAST SURGICAL HISTORY:  1. Bilateral cataract surgery.  2. Abdominal hysterectomy.   ALLERGIES: Penicillin, reaction is unknown. This is a child allergy. Adhesive also causes itching and rash.   MEDICATIONS:  1. Calcium 600 + D 600 mg/200 international units 1 tablet daily.  2. Dexamethasone 10 mg, one tablet daily.  3. Nystatin 100,000 units/mL 5 mL four times a day, swish and swallow.  4. Omeprazole 20 mg daily.  5. Ondansetron 4 mg, one tablet every eight hours as needed for nausea and vomiting.  6. Oxymorphone 5 mg 1 tablet every four hours as needed for pain.  7. ProAir HFA 90 mcg inhaled 1 puff inhaled 2 times a day as needed for shortness of breath. 8. Prochlorperazine 10 mg 1 tablet 3 times a day as needed.  9. Symbicort 80 mcg/4.5 mcg 1 puff inhaled twice a day.  10. Per oncologist outpatient note, the patient is also to be started on Zometa.   FAMILY HISTORY: Mother is deceased, had colon cancer, diabetes and myocardial infarction. Sister is living, had colon cancer. Father is living and has diabetes. Sister also has diabetes.   SOCIAL HISTORY: 50 pack-year history of tobacco, quit one month ago when she was diagnosed with lung cancer. Denies alcohol or illicit drug use. She lives with her husband.   REVIEW OF SYSTEMS: CONSTITUTIONAL: Admits to fever, chills and fatigue and significant weight loss, most recently  4 pounds this week. EYES: Denies blurred, double vision. Denies eye pain. ENT: Denies tinnitus, ear pain. RESPIRATORY: Admits to intermittent productive cough that is productive of yellow sputum with some blood streaks. Admits to wheezing intermittently. Admits to dyspnea with activity. Has asthma. CARDIOVASCULAR: Denies chest pain, edema, or palpitations.  GASTROINTESTINAL: Admits to nausea, vomiting, diarrhea, denies abdominal pain, melena, or rectal bleeding. GENITOURINARY: Denies dysuria or hematuria. ENDOCRINE: Has increased thirst. INTEGUMENT: Denies any new rashes. MUSCULOSKELETAL: Denies myalgias or arthralgias. NEUROLOGIC: Denies numbness or headaches. PSYCH: Denies anxiety or depression.   PHYSICAL EXAMINATION:  VITAL SIGNS: Temperature 95.6,  heart rate 132, respirations 14, blood pressure 83/51, sating 93% on room air.   GENERAL: Cachectic, elderly female, resting comfortably in bed in no acute distress.   EYES: Anicteric sclerae. Pupils equal, round, and reactive to light and accommodation.   ENT: Normal external ears and nares. Posterior oropharynx is clear. There is no evidence of thrush currently.   NEUROLOGICAL: Cranial nerves II through XII are intact. Sensation and motor function grossly intact.   CARDIOVASCULAR: Regular rate and rhythm. No murmurs appreciated. No pretibial edema.   RESPIRATORY: Left upper lobe has rhonchorous breath sounds. Otherwise, lung fields are clear. Normal effort currently.   ABDOMEN: Soft, nontender, nondistended without hepatosplenomegaly.   SKIN: No rashes or lesions noted.   SKIN: Warm and dry.   PSYCH: The patient is awake, alert and oriented x3. Judgment and insight are intact.   LABORATORY, RADIOLOGICAL AND DIAGNOSTIC DATA: White count 16.2, increased from 1.6 just two days ago on 10/31. Hemoglobin 10.2, hematocrit 31.6, platelets 358 with an MCV of 76. BMP shows glucose 160, BUN 18, creatinine 0.9, sodium 130, potassium 4.3, chloride 96, bicarbonate 22, calcium 8.6, bilirubin 0.5, alkaline phosphatase 133, ALT 28, AST 23, total protein 6.2, albumin 2.4, osmolality 266, anion gap 12. Troponin is less than 0.02. CK 25, CK-MB is less than 0.5. EKG shows normal sinus rhythm at 96 beats per minutes. There is low voltage diffusely. There is incomplete right bundle branch block.   Chest x-ray  shows persistent atelectasis left upper lobe with possible underlying mass. Atelectasis progressed from x-ray obtained 12/24/2011. Atelectasis of the left lung base is stable. No acute cardiopulmonary disease noted.   ASSESSMENT AND PLAN: A 74 year old female with metastatic squamous cell lung carcinoma, to bone and brain presenting with fevers, chills, nausea, vomiting, diarrhea, found to be tachycardic with leukocytosis and hypotensive concerning for severe sepsis.  1. Severe sepsis, question abdominal source either from translocation versus acute infectious diarrhea versus typhilitis. At this time will continue the patient on empiric antibiotics of vancomycin and aztreonam. Will check stool cultures, fecal leukocytes and C. difficile and if C. difficile is positive, we will initiate Flagyl. We will continue IV fluids at this time for systolic blood pressure greater than 90. If the patient is persistently hypotensive, we will initiate pressors  and this has been discussed with the family. We will check a lactic acid. We will check urinalysis, urine cultures, as well as blood cultures and antibiotics as indicated. Will check CT of the abdomen to rule out acute colitis.  2. Severe hypotension with concerns for shock. Continue early goal-directed therapy and continue work-up for infectious etiology.  3. Hyponatremia. Most likely due to hypovolemia, may also have component of SIADH. Will monitor. 4. Metastatic lung carcinoma to bone and brain, stage IV. We will consult oncology for input. We continue her calcium and vitamin D supplementation. Continue dexamethasone. Continue nystatin swish and  swallow. 5 Asthma. We will continue Symbicort and albuterol.  6. Gastroesophageal reflux disease. Continue omeprazole.  7. Prophylaxis. Lovenox.  8. CODE STATUS: DO NOT RESUSCITATE, DO NOT INTUBATE. This was discussed with the patient in the presence of her sister and daughter. Surrogate decision makers are her  daughter, Maurie BoettcherGinger Hawkins, and her husband, Sid Falconrthur Cosma.   DISPOSITION: The patient is being admitted to inpatient hospitalist service for ongoing management of severe sepsis.   CRITICAL CARE TIME SPENT: 50 minutes.   ____________________________ Aurther LoftAdaorah E. Wilfrido Luedke, DO aeo:ap D: 01/31/2012 01:14:06 ET T: 01/31/2012 11:04:49 ET JOB#: [id number]  cc: Aurther LoftAdaorah E. Haileigh Pitz, DO, <Dictator> Demetrius CharityVeshana S. Wendie Simmeramiah, MD Letta PateJohn B. Danne HarborWalker III, MD Mavrick Mcquigg E Joey Lierman DO ELECTRONICALLY SIGNED 02/01/2012 2:42

## 2014-07-17 NOTE — Consult Note (Signed)
History of Present Illness:   Reason for Consult Sepsis,Diarrhea post chemotherapy 01/22/12.Marland Kitchen.Patient with metastatic squamous cell Lung cancer with mets to bone    Previous Investigations 01/15/12 bone biopsy     Pathology Report metastatic squamous cell carcinoma Lung    Planned Treatment Regimen Carbo/Taxol commenced 01/22/12    HPI   This is a 74 year old great lady referred to us for an abnormal PET CT scan.  Patient went to ED in late September with increasing shortness of breath and was treated for a pneumonia with Z-Pak.  Her symptoms did not improve and she saw Dr. Dan HumphreysWalker who ordered a chest CT scan on 12/22/2011.  This showed near complete collapse of the left upper lobe with a left perihilar sof soft tissue cavitary mass suspicious for malignancy.on 12/30/11 showed a large area of abnormal activity in the left hilum and infrahilar region extending to left lower lobe ,increased uptake associated with an enlarged lymph node in the right aspect of the neck and a lytic lesion involving the right anterior superior iliac spine.does admit to significant amount of weight loss , 60 pounds in the past 6 months. Patient does have a significant smoking history with a medical history significant for hyperyperlipidemia and hypertension.right iliac crest positive for metastatic squamous cell carcinoma. 1 Carbo/Taxol chemo 01/22/12. Developed diarrhea 2 days post chemo with increasing frequency loose watery stools and developed fever and chills 01/30/12 and admitted from ED with possible sepsis.  PFSH:   Family History positive, mum and sister with colon ca    Social History negative alcohol, positive tobacco   Review of Systems:   General fever  chills  weakness    Performance Status (ECOG) 3    HEENT stomatitis    GI diarrhea    Psych no complaints   Physical Exam:   General Frail cachectic female in no acute distress    HEENT: normal    Lungs: clear    Cardiac: regular rate, rhythm      Abdomen: soft  nontender  positive bowel sounds    Skin: intact    Extremities: No edema, rash or cyanosis    Neuro: AAOx3  cranial nerves intact    Psych: normal appearance  alert and cooperative    PCN: Unknown  Adhesive: Itching, Rash  Radiology Results: CT:    03-Nov-13 11:49, CT Abdomen and Pelvis With Contrast   CT Abdomen and Pelvis With Contrast    REASON FOR EXAM:    (1) diarrhea; (2) diarrhea  COMMENTS:       PROCEDURE: CT  - CT ABDOMEN / PELVIS  W  - Jan 31 2012 11:49AM     RESULT: CT of the abdomen and pelvis is performed with 85 mL of   Isovue-300 iodinated intravenous contrast and oral contrast with images   compared to previous exam dated 17 July 2011.    There is left pleural effusion with small amount present. The lung bases   appear clear otherwise with minimal atelectasis in the right lower lobe.   There is persistent abnormal appearance of the colon with diffuse   thickening of the wall in the colon consistent with colitis. Abnormal   loops of small bowel are seen in the pelvis with significant thickening   of the wall consistent with enteritis. There is concern for enterocolic     fistula best appreciated coming from the right or medial aspect of the   sigmoid colon on image #108 with the collection seen on  image 107 and   connection to a loop of small bowel in the area of image 103. Surgical   consultation is recommended. There is no abscess or evidence of   pneumoperitoneum. The liver, spleen, kidneys, adrenal glands, aorta and   gallbladder appear unremarkable. The pancreas shows no mass or   surrounding inflammation. The urinary bladder contains urine with a   moderate amount present.    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 No pneumoperitoneum or abscess formation   evident. Small left pleural effusion. Atherosclerotic changes are noted.    DictationSite: 6      Verified By:  Elveria Royals, M.D., MD   Assessment and Plan:  Impression:   This is a 74 year old female with stage IV metastatic squamous cell lung Cancer  with mets to bone s/p chemotherapy 01/22/12 now with fever, profuse diarrhea with CT scan showing possible enterocolonic fistula.  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 Surgical consult.On Octreotide.diff negativerelated to diarrhea and possible sepsis-Blood cultures pending. On Iv fluids and IV antibiotics.Zometa as outpatient  Electronic Signatures: Antony Haste (MD)  (Signed [phone] 20:06)  Authored: HISTORY OF PRESENT ILLNESS, PFSH, ROS, PE, ALLERGIES, HOME MEDICATIONS, OTHER RESULTS, ASSESSMENT AND PLAN   Last Updated: 03-Nov-13 20:06 by Antony Haste (MD)

## 2014-07-17 NOTE — Consult Note (Signed)
PATIENT NAME:  Summer Ellis, Summer Ellis MR#:  [id number] DATE OF BIRTH:  December 06, 1940  DATE OF CONSULTATION:  03/03/2012  REFERRING PHYSICIAN:  Dr. Wendie Simmer CONSULTING PHYSICIAN:  Quentin Ore III, MD  CHIEF COMPLAINT: Abdominal pain.   BRIEF HISTORY: Summer Ellis is a very pleasant 74 year old woman known to our service from previous evaluation. 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 At the time she was having minimal abdominal symptoms other than some mild left lower quadrant pain. No significant nausea or vomiting. She did have some mild change in her bowel habits, in addition, with some occasional diarrhea. We elected to simply follow her problem at that time as she was minimally symptomatic and did not want to consider any surgical intervention. Of note is the fact was she had significant weight loss and was concerned about that problem more than her abdominal pain. She has had a history of significant chronic obstructive lung disease. Further work-up over the summer demonstrated what appears to be stage IV lung cancer with metastases to her bone marrow. She is taking chemotherapy for that problem at the present time.   She was admitted on 12/04 with complaints of increasing abdominal pain, nausea, and vomiting over the last several weeks. The problem has been progressive and CT scan was again performed which demonstrated persistent enterocolonic fistula. The surgical service was consulted. She had had a PET scan previously which did not demonstrate any evidence of bowel involvement. She is in the midst of her chemotherapy at the present time taking multiple chemotherapy medications.   At the present time she says she is comfortable now without nausea or vomiting and her abdominal pain is minimal. She did not have any significant abdominal findings and she does not have any significant change  in her bowel function currently. She is a long-standing cigarette smoker, but does not drink any alcohol.   REVIEW OF SYSTEMS: Otherwise unremarkable.   MEDICATIONS: Outlined in her current admission note.  1. Colace 100 mg p.o. b.i.d.  2. Omeprazole 20 mg p.o. daily.  3. Zofran 4 mg p.o. q.8 hours p.r.n.  4. Oxymorphone 5 mg p.o. every four hours p.r.n.  5. Symbicort 80 mcg/4.5 mcg inhaler b.i.d.  6. ProAir 90 mcg inhaler one puff b.i.d.   ALLERGIES: She is allergic to penicillin.   PHYSICAL EXAMINATION:  GENERAL: She is an alert very pleasant woman, is obviously comfortable in no distress.   VITAL SIGNS: She is afebrile. Blood pressure 106/71, heart rate 84 and regular, oxygen saturation 97% on room air.   HEENT: She is bald from hair loss with her chemotherapy. No scleral icterus. No facial deformities. No pupillary abnormalities.   NECK: Supple without adenopathy. Trachea is midline. I cannot palpate her thyroid gland.   CHEST: Clear with very distant breath sounds. She has no adventitious sounds. She has normal pulmonary excursion. She has no chest pain or tenderness.   CARDIAC: There were no murmurs or gallops. She seems to be in normal sinus rhythm.   ABDOMEN: Mildly distended with minimal left lower quadrant tenderness. I cannot palpate any masses in the left lower quadrant. She has no rebound or guarding. She has hypoactive but present bowel sounds.   EXTREMITIES: Lower extremity exam reveals no deformities. Full range of motion, good distal pulses.   PSYCHIATRIC: Normal affect and normal orientation.   IMPRESSION/RECOMMENDATIONS: I have independently reviewed her last several CT scans. 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 There did not appear to be any contrast in the fistula at the present time. I do not see any evidence that there is any perforation or leak. The etiology is likely diverticular although  other inflammatory bowel diseases certainly cannot be ruled out. In a different clinical situation, we would recommend surgical intervention, but with stage IV lung cancer and her symptoms being minimal at this point, I do not think surgery is indicated. We talked with her about the options at this point and we will simply follow her along until her symptoms worsen or evolve.   I appreciate the opportunity to be of service.    ____________________________ Carmie Endalph L. Ely III, MD rle:ap D: 03/03/2012 13:40:56 ET T: 03/03/2012 14:13:00 ET JOB#: [id number]  cc: Carmie Endalph L. Ely III, MD, <Dictator> Demetrius CharityVeshana S. Wendie Simmeramiah, MD Letta PateJohn B. Danne HarborWalker III, MD Quentin OreALPH L ELY MD ELECTRONICALLY SIGNED 03/03/2012 17:11

## 2014-07-17 NOTE — Discharge Summary (Signed)
PATIENT NAME:  Summer Ellis, [name] MR#:  [id number] DATE OF BIRTH:  [birth year]  DATE OF ADMISSION:  01/31/2012 DATE OF DISCHARGE:  02/02/2012  HISTORY OF PRESENT ILLNESS: Summer Ellis is a 74 year old white lady with the recent diagnosis of metastatic squamous cell lung cancer, stage IV. The patient had just completed her initial series of chemotherapy and radiation. She developed diffuse diarrhea associated with chills and fever. She had had generalized weakness and decreased p.o. intake. In the emergency room, she was found to be relatively hypotensive and tachycardic. She also had some leukocytosis. The patient was admitted with a tentative diagnosis of SIRS.   PAST MEDICAL HISTORY:  1. Recent diagnosis of metastatic lung cancer, treated with carbo/Taxotere, also two radiation treatments.  2. Hypertension.  3. Hyperlipidemia.  4. Asthma.   ALLERGIES: Penicillin.   MEDICATIONS ON ADMISSION:  1. Dexamethasone 10 mg daily.  2. Nystatin 5 mL four times daily.  3. Zofran 4 mg every eight hours as needed. 4. Oxymorphone 5 mg every 4 hours as needed.  5. Provera 2 puffs four times daily as needed.  6. Prochlorperazine 10 mg one tablet three times daily as needed.  7. Symbicort 80/4.5 inhaler one puff twice a day. 8. Most recent chemotherapy.   ADMISSION PHYSICAL EXAMINATION: Examination revealed a temperature of 95.6, a pulse rate of 132, a respiration of 14, and a blood pressure initially of 83/51. Examination as described by the admitting physician was most notable for bronchial breath sounds heard over the left upper lobe of the lung. The remainder of the examination was basically unremarkable.   RESULTS: Admission CBC showed a hemoglobin of 10.2 with a hematocrit of 31.6. White count was 358,000. White count was 16,200. Admission comprehensive metabolic panel was notable for a random blood sugar of 160, a sodium of 130, a chloride of 96, a total protein of 6.2, and an albumin of 2.4.   Blood  cultures drawn on admission showed no growth.   Comprehensive stool culture was negative for Salmonella, Campylobacter, or Escherichia coli.   C. difficile was negative.   Urine culture grew out insignificant growth.   HOSPITAL COURSE: The patient was admitted to the regular medical floor where she was covered empirically with antibiotics pending cultures. She was rehydrated with IV fluids. A CT scan of the abdomen was suggestive of enterocolitis with an enterocolic fistula. The patient however was seen in consultation by gastroenterology who felt that her diarrhea was totally due to her chemotherapy. The patient did improve with just rehydration and a vacation from her chemotherapy. She did continue to receive radiation during her hospitalization. At the time of discharge, her vital signs were normal and her diarrhea had cleared.   DISCHARGE DIAGNOSES:  1. Dysentery due to chemotherapy.  2. Metastatic squamous cell lung cancer.  3. Chronic obstructive pulmonary disease.  4. Hypertension.   DISCHARGE MEDICATIONS: The patient was discharged on her preadmission medications without change. She was also discharged on a regular diet with activity as tolerated. She was discharged to keep her regular appointment with the Cancer Center.  ____________________________ Letta PateJohn B. Danne HarborWalker III, MD jbw:slb D: 02/15/2012 08:13:29 ET     T: 02/15/2012 11:41:15 ET       JOB#: [id number] Elmo PuttJOHN B WALKER III MD ELECTRONICALLY SIGNED 02/15/2012 17:05

## 2014-07-17 NOTE — Consult Note (Signed)
Pt CC is diarrhea and metastatic lung cancer.  Pt doing much better today with NO diarrhea today.  No abd pain or nausea or vomiting.  No new suggestions, agree with regular diet.   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  Electronic Signatures: Scot JunElliott, Mehran Guderian T (MD)  (Signed on 04-Nov-13 18:01)  Authored  Last Updated: 04-Nov-13 18:01 by Scot JunElliott, Kelley Knoth T (MD)

## 2014-07-17 NOTE — Consult Note (Signed)
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  No further work up of this is recommended given her metastatic lung cancer.  Previous CT of abdomen showed changes with tethering of small bowel likely from adhesions.  Doubt Inflammatory bowel disease or bowel cancer although can't be sure.    Electronic Signatures: Scot JunElliott, Shakeela Rabadan T (MD)  (Signed on 03-Nov-13 17:58)  Authored  Last Updated: 03-Nov-13 17:58 by Scot JunElliott, Ania Levay T (MD)

## 2014-07-17 NOTE — Consult Note (Signed)
PATIENT NAME:  Summer Ellis, Summer Ellis MR#:  [id number] DATE OF BIRTH:  04/17/1940  DATE OF CONSULTATION:  01/31/2012  REFERRING PHYSICIAN:  Dr. Mickel Duhamel PHYSICIAN:  Manya Silvas, MD  HISTORY OF PRESENT ILLNESS: The patient is a 74 year old white female who was admitted to the hospital because of fever, chills, and diarrhea of a severe nature. The patient started chemotherapy on 10/25, has had one session of chemotherapy and two sessions of radiation. She developed severe diarrhea, going greater than 10 times a day, watery, green, foul- smelling stool and was admitted to the hospital. I was asked to see her in consultation after a CT scan showed a fistula between the colon and the small intestine.   The patient had a CT scan of the abdomen done in April of this year and this showed areas of small bowel wall thickening, tethering of the small bowel which may represent inflammatory changes. A loop of large bowel was identified with asymmetric wall thickening and stranding in the mesenteric fat, evidence of diverticulosis in this area. She saw a surgeon in consultation who did not feel surgery was indicated.   The patient had a previous colonoscopy attempt by Dr. Gustavo Lah in 05/2009 and the sigmoid colon showed multiple extremely sharp turns and the scope could not be passed through the colon. A few small-mouth diverticula were seen.   The patient denies any abdominal pain, has had some decreased oral intake but no significant vomiting. Her diarrhea has been nonbloody.   The patient presented to the ER where she was found to be tachycardic and hypotensive and was admitted to the hospital.   The patient has recently diagnosed metastatic squamous cell lung cancer stage IV with mets to bone and brain. Her radiation was for sacroiliac bone met.   PAST SURGICAL HISTORY:  Bilateral cataracts and abdominal hysterectomy. She thinks it was a partial hysterectomy.   ALLERGIES: Penicillin, adhesive tape.     MEDICATIONS ON ADMISSION:  1. Dexamethasone 10 mg a day.  2. Nystatin 100,000 units 4 times a day.  3. Omeprazole 20 mg a day.  4. Odansetron 4 mg q. 8 hours p.r.n. nausea and vomiting.  5. Oxymorphone 5 mg 1 tablet every four hours.  6. ProAir HFA 90 mcg, 1 puff 2 times a day.  7. Prochlorperazine 10 mg, 1 tablet 3 times a day.  8. Symbicort 80 mcg/4.5 mcg, 1 puff twice a day.   FAMILY HISTORY: Mother with colon cancer and a sister with colon cancer.   HABITS: 50 pack-year history, quit smoking a month ago when she was diagnosed with lung cancer.   REVIEW OF SYSTEMS: Copious diarrhea, fever, chills, loss of appetite. No abdominal pain. No rectal bleeding. No hematemesis. No dysuria or hematuria.   PHYSICAL EXAMINATION:  GENERAL: White female in no acute distress, looks about stated age.   VITAL SIGNS: Temperature 98.4, pulse 78, blood pressure 102/64.   HEENT: Sclerae are anicteric. Conjunctivae somewhat pale. Tongue is negative.   HEAD: Atraumatic.   CHEST: Poor breath sounds to nonexistent in the left lung fields. Airflow better in the right lung field.   HEART: No murmurs or gallops I can hear.   ABDOMEN: Nontender. No hepatosplenomegaly. No masses. No bruits. No significant tenderness.   EXTREMITIES: No edema.   LABORATORY DATA: Glucose 160, BUN 18, creatinine 0.9, sodium 126, repeat 130, potassium 4.3, chloride 96, CO2 22, total protein 6.2, albumin 2.4, total bilirubin 0.5, alkaline phosphatase 133, SGOT 23, SGPT 28, white count  on 10/31 was 1.6, today it is 16.2, hemoglobin 10.2, hematocrit 31.6, platelet count 358. Stools negative for C. difficile. No growth from blood cultures, 0 to 5 white cells. Urinalysis shows 12 white cells per high-power field, 1+ leukocyte esterase. Blood gas: pH 7.42, pCO2 32, pO2 75.   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 This is likely the cause for her fistula with some scarring in the abdomen  possibly from her hysterectomy, possibly from recurrent bouts of diverticulitis. The absence of tenderness means that there is no active diverticulitis at the moment. This fistula does not need to be addressed in any aggressive manner. The only significant cause it may be producing would be intestinal overgrowth of the small intestine because of the connection with the colon.   Rarely a fistula can occur because of neoplasm or inflammatory bowel disease. Given the previous CT scan and the current one, neither one of these two entities is likely to be present. Given her problems with metastatic lung cancer stage IV, I do not recommend any endoscopic or barium study of the colon at this time. Her diarrhea is likely due to chemotherapy, and would expect it to respond in the usual time period for such bouts of diarrhea. I will follow with you.   ____________________________ Manya Silvas, MD rte:bjt D: 01/31/2012 17:55:00 ET T: 02/01/2012 05:54:46 ET JOB#: [id number]  cc: Cheral Marker. Ola Spurr, [place] Sarina Ser, MD Manya Silvas, MD, <Dictator> Manya Silvas MD ELECTRONICALLY SIGNED 02/10/2012 17:20

## 2014-07-20 NOTE — Discharge Summary (Signed)
PATIENT NAME:  Summer Ellis, [name] MR#:  [id number] DATE OF BIRTH:  1941/03/01  DATE OF ADMISSION:  07/10/2012 DATE OF DISCHARGE:  07/14/2012  HISTORY OF PRESENT ILLNESS:  Ms. Summer Ellis is a 74 year old lady being followed in oncology for a history of squamous cell carcinoma of the lung with a metastatic lesion to the spine. The patient was brought to the Emergency Room by her family when she had the sudden onset of confusion and dysphagia. In the Emergency Room, she was noted to have a tonic-clonic seizure.   The patient's past medical history was most notable for the metastatic squamous cell carcinoma of the lung.   The patient was noted to be allergic to penicillin.  The patient's admission medications included Symbicort 1 puff b.i.d., omeprazole 20 mg daily, oxymorphone 5 mg every 4 hours p.r.n., and ProAir 1 puff b.i.d.   The patient's admission physical examination, as described by the admitting physician, was notable only for her confusion.   The patient's CT scan of the head in the Emergency Room showed a moderate area of vasogenic edema in the left parietal lobe. Comprehensive metabolic panel was notable for a blood sugar of 103, a BUN of 13, a creatinine of 0.8, a sodium of 136, a potassium of 3.2, a chloride of 103, a CO2 of 27, a calcium of 9.9 and normal LFTs. CBC showed a hemoglobin of 11.5 and a platelet count of 387,000.   HOSPITAL COURSE: The patient was started on Cerebyx in the Emergency Room and admitted to the regular medical floor. Her garbled speech eventually resolved, although she remains somewhat confused throughout her hospitalization. Follow-up MRI of the brain confirmed a 1.6 cm enhancing mass in the left posterior parietal occipital region with surrounding edema. This was despite the fact the patient had had a negative PET scan of that area within the past month. The patient was seen by her oncologist. She was also seen in consultation by Dr. Aggie Cosierrystal. She was started on radiation  treatments while she was in the hospital, which she tolerated well. These are to be completed as an outpatient.    DISCHARGE DIAGNOSES:  1.  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  2.  Seizure disorder, secondary to #1.   DISCHARGE MEDICATIONS: 1.  ProAir 1 puff b.i.d.  2.  Symbicort 80/4.5 inhaler 1 puff b.i.d.  3.  Omeprazole 20 mg daily.  4.  Phenergan 10 mg 3 times a day as needed.  5.  Oxymorphone 5 mg every 4 hours as needed. 6.  Zofran 4 mg every 8 hours as needed.  7.  Medrol 4 mg 1 tablet every 6 hours.  8.  Dilantin 300 mg at bedtime.   DISCHARGE DISPOSITION: The patient was discharged on a regular diet with activity as tolerated. She is to follow-up with radiation oncology on Easter Monday.  ____________________________ Letta PateJohn B. Danne HarborWalker III, MD jbw:sb D: 08/01/2012 06:17:18 ET T: 08/01/2012 07:40:28 ET JOB#: [id number]  cc: Letta PateJohn B. Danne HarborWalker III, MD, <Dictator> Elmo PuttJOHN B WALKER III MD ELECTRONICALLY SIGNED 08/01/2012 16:37

## 2014-07-20 NOTE — Consult Note (Signed)
History of Present Illness:  Reason for Consult New metatstatic brain lesion in patient with Stage IV lung cancer   Date of Diagnosis 22-Dec-2011   Pathology Report squamous cell cancer 01/14/12   HPI   This is a 74 year old great lady referred to [place] for an abnormal PET CT scan.  Patient went to ED in late September with increasing shortness of breath and was treated for a pneumonia with Z-Pak.  Her symptoms did not improve and she saw Dr. Gilford Rile who ordered a chest CT scan on 12/22/2011.  This showed near complete collapse of the left upper lobe with a left perihilar sof soft tissue cavitary mass suspicious for malignancy.on 12/30/11 showed a large area of abnormal activity in the left hilum and infrahilar region extending to left lower lobe ,increased uptake associated with an enlarged lymph node in the right aspect of the neck and a lytic lesion involving the right anterior superior iliac spine.confirmed NSCLC and patient commenced chemotherapy for stage IV non-small cell lung which he completed 12/27 2013 with good response in terms of overall disease.  Chemotherapy complicated by episodes of enterocolitis. 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   PFSH:  Family History positive, mum colon ca   Social History negative alcohol, positive tobacco, 50 pack yr   Review of Systems:  General weakness   Performance Status (ECOG) 2   Neuro seizure  3 days ago   Psych no complaints   Physical Exam:  General awake,alert,in no acute distress   HEENT: normal   Lungs: clear   Cardiac: regular rate, rhythm   Breast: not examined   Abdomen: soft  nontender  positive bowel sounds   Extremities: No edema, rash or  cyanosis   Neuro: AAOx3  cranial nerves intact   Psych: normal appearance  alert and cooperative  mood calm    PCN: Unknown  Adhesive: Itching, Rash  Laboratory Results:  Hepatic:  07-Apr-14 09:14   Bilirubin, Total 0.4  Alkaline Phosphatase 57  SGPT (ALT) 12  SGOT (AST)  11  Total Protein, Serum 7.0  Albumin, Serum  2.8  Routine Chem:  07-Apr-14 09:14   Glucose, Serum  140  BUN 13  Creatinine (comp) 0.85  Sodium, Serum 136  Potassium, Serum 4.0  Chloride, Serum 100  CO2, Serum 27  Calcium (Total), Serum 8.5  Anion Gap 9  Osmolality (calc) 274  eGFR (African American) >60  eGFR (Non-African American) >60 (eGFR values <18m/min/1.73 m2 may be an indication of chronic kidney disease (CKD). Calculated eGFR is useful in patients with stable renal function. The eGFR calculation will not be reliable in acutely ill patients when serum creatinine is changing rapidly. It is not useful in  patients on dialysis. The eGFR calculation may not be applicable to patients at the low and high extremes of body sizes, pregnant women, and vegetarians.)  Routine Hem:  07-Apr-14 09:14   WBC (CBC)  14.8  RBC (CBC)  3.73  Hemoglobin (CBC)  10.3  Hematocrit (CBC)  32.4  Platelet Count (CBC) 294  MCV 87  MCH 27.7  MCHC  31.8  RDW 14.2  Neutrophil % 88.9  Lymphocyte % 5.1  Monocyte % 5.5  Eosinophil % 0.2  Basophil % 0.3  Neutrophil #  13.1  Lymphocyte #  0.8  Monocyte # 0.8  Eosinophil # 0.0  Basophil # 0.0 (Result(s) reported on 04 Jul 2012 at 09:27AM.)   Assessment and Plan: Impression:   74 year old female stage IV non-small cell lung cancer completed chemotherapy in November 2013 with presentation now new-onset seizure.  MRI brain confirms metastatic lesion left parietal occipital  area.radiation oncology input patient the patient currently undergoing radiation therapy whole brain. Plan:   1. NSCLC- Stage IV with new brain lesion-I discussed with patient and her husband  today that this is metastatic disease and she would need to complete her radiation therapy at which time we would discuss further options of treatment in terms of chemotherapy.Seizure- new onset: New brain met. patient clinically well today without any neurological signs and symptoms.Currently receiving radiation therapy.On discharge in am ,She will need to continue her dexamethasone as well as Dilantin as an outpatient.will follow up with [place] in the next 1-2 weeks.  Electronic Signatures: Georges Mouse (MD)  (Signed 16-Apr-14 14:36)  Authored: HISTORY OF PRESENT ILLNESS, PFSH, ROS, PE, ALLERGIES, HOME MEDICATIONS, LABS, ASSESSMENT AND PLAN   Last Updated: 16-Apr-14 14:36 by Georges Mouse (MD)

## 2014-07-20 NOTE — Op Note (Signed)
PATIENT NAME:  Summer GibneyFORD, Delecia P MR#:  [id number] DATE OF BIRTH:  January 26, 1941  DATE OF PROCEDURE:  08/05/2012  PREOPERATIVE DIAGNOSES:  1.  Stage IV lung cancer with brain metastasis. 2.  Anemia.  3.  Nausea and vomiting.   POSTOPERATIVE DIAGNOSES:  1.  Stage IV lung cancer with brain metastasis. 2.  Anemia.  3.  Nausea and vomiting.   PROCEDURES:   1.  Ultrasound guidance for vascular access to right basilic vein.  2.  Fluoroscopic guidance for placement of catheter.  3.  Insertion of peripherally inserted central venous catheter, double-lumen, right arm.    SURGEON: Annice NeedyJason S. Shakela Donati, M.D.   ANESTHESIA: Local.   ESTIMATED BLOOD LOSS: Minimal.   INDICATION FOR PROCEDURE: This is a 74 year old white female admitted from the Cancer Center with multiple ongoing issues. We were asked to place a PICC line.   DESCRIPTION OF PROCEDURE: The patient's right arm was sterilely prepped and draped, and a sterile surgical field was created. The right basilic vein was accessed under direct ultrasound guidance without difficulty with a micropuncture needle and permanent image was recorded. 0.018 wire was then placed into the superior vena cava. Peel-away sheath was placed over the wire. A single lumen peripherally inserted central venous catheter was then placed over the wire and the wire and peel-away sheath were removed. The catheter tip was placed into the superior vena cava and was secured at the skin at 32 cm with a sterile dressing. The catheter withdrew blood well and flushed easily with heparinized saline. The patient tolerated procedure well.   ____________________________ Annice NeedyJason S. Khamarion Bjelland, MD jsd:jm D: 08/08/2012 09:45:39 ET T: 08/08/2012 13:25:31 ET JOB#: [id number]  cc: Annice NeedyJason S. Etai Copado, MD, <Dictator> Annice NeedyJASON S Lorriane Dehart MD ELECTRONICALLY SIGNED 08/15/2012 13:53

## 2014-07-20 NOTE — Consult Note (Signed)
Reason for Visit: This 74 year old Female patient presents to the clinic for initial evaluation of .   Referred by Ramniah.  Diagnosis:  Chief Complaint/Diagnosis   74 yo woman with history of NSCLC diagnosed in October 2013.  She has been previously treated with radiation in November 2013 for metastatic disease of the righ tiliac wing.  She tolerated the treatment well and her pain resolved.  She has not had radiation to the chest as she presented with metastatic disease.  She had a difficult course of chemotherapy but has responded fiarly well.  Her course has been complicated most notably by an intracolic fistula.  Her recent PET/CT has shown improvement but this Sunday she was found to have right upper extremity weakness and difficulty finding words.  She was brought to the ER and head CT showed a mass in the left parietal lobe.  She was started on Decadron and IV dilantin to control the seizure activity.  Further workup included an MRI of the brain which showed a 1.6cm left parietal lesion with significant edema.  She has been sent here for our opinion regarding the role of radiation therapy.   Past, Family and Social History:  Past Medical History positive   Cardiovascular hypertension   Respiratory COPD   Gastrointestinal intracolic fistula   Cancer lung   Lung Cancer Treatment Details chemotherapy   Lung Cancer Related Details metastasis present; brain and bone   Neurological/Psychiatric seizure   Allergies:   PCN: Unknown  Adhesive: Itching, Rash  Review of Systems:  Negative General Symptoms no fever  no chills   General Symptoms weight loss  fatigue  strength decreased   Skin negative   Breast negative   Ophthalmologic negative   ENMT negative   Gastrointestinal negative   Genitourinary negative   Musculoskeletal negative   Negative Neurological Symptoms no headache  cranial nerves intact   Neurological Symptoms focal seizures; confusion    Psychiatric negative   Hematology/Lymphatics negative   Endocrine negative   Physical Exam:  General/Skin/HEENT:  General Details thin   Skin normal   Eyes normal   ENMT normal   Head and Neck normal   Breasts/Resp/CV/GI/GU:  Breasts not examined   Cardiovascular normal   MS/Neuro/Psych/Lymph:  Neurological Details responds to pain; responds to verbal commands; sensation intact; cranial nerves intact; somewhat confused and husband answering most questions   Assessment and Plan: Impression:   74 yo woman with metastatic lung cancer with new brain met.  She is symptomatic from the tumor but also from the significant amount of edema.   Plan:   Her MRI was reviewed and discussed with the patient and her husband.  I have recommended a course of whole brain radiation therapy. I discussed the option of neurosurgery but she is not a candidate for surgery due to her MMP. She will be simulated today and will begin treatment tomorrow.  The plan is for her to receive 10 fractions of radiation.  I am hopeful that with the combination of the decadron and the radiation that her confusion improves.  The risks, benefits and side effects were explained and are including, but not limited to, fatigue, hair losss, decreased lood counts, skin irritation, skin erythema/hyperpigmentation/desquamation, nausea, vomiting, weight loss, loss of appetite, sense of ear fullness, damage to any bone, cartilage, soft tissue within the treated area and in the long term memory loss, neurocognitive deficits and hair not returning as it is currently.  She was in agreement with proceeding  with treatment and a consent form was signed today.   She was simulated and will start tomorrow.  Electronic Signatures: Altamease Oiler (MD)  (Signed 15-Apr-14 16:31)  Authored: HPI, Diagnosis, PFSH, Allergies, ROS, Physical Exam, Encounter Assessment and Plan   Last Updated: 15-Apr-14 16:31 by Altamease Oiler  (MD)

## 2014-07-20 NOTE — H&P (Signed)
PATIENT NAME:  Summer Ellis, Summer Ellis MR#:  [id number] DATE OF BIRTH:  04/15/1940  DATE OF ADMISSION:  07/10/2012  PRIMARY CARE PROVIDER: Dr. Dan HumphreysWalker  ED REFERRING DOCTOR: Dr.  Suella BroadLinda Taylor.    CHIEF COMPLAINT: Confusion as well as difficulty with speech.   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 Who has received first cycle with carboplatin and Taxol and then subsequently received carboplatin and gemcitabine. That cycle was on 02/11/2012, who presents today, becoming disoriented all of a sudden this afternoon, according to her daughter. Prior to that, she was fine vacuuming. The patient also had difficulty with what she wanted to say. She was confused. The patient came to the ED and had a CT scan which showed a large area of vasogenic edema without any discrete mass on the CT scan. While in the ED, she also had a tonic-clonic seizure. She will was started on Dilantin. Currently she is confused, wanting to get out of bed and go to the bathroom. She is able to spontaneously move all her extremities.   PAST MEDICAL HISTORY: 1.  Squamous cell cancer with metastasis.  2.  History of diverticulitis.  3.  History of asthma.   ALLERGIES: PENICILLIN, ADHESIVES.  MEDICATIONS:  The patient is to medications that were available from clinic visit: Promethazine 10 mg 1 tab Ellis.o. t.i.d. as needed, Symbicort 80 micrograms 1 puff b.i.d., omeprazole 20 mg 1 tab Ellis.o. daily, oxymorphone 5 mg q. 4 hours Ellis.r.n. for pain,  senna 1 tab Ellis.o. daily at bedtime, ProAir 1 puff b.i.d.   SOCIAL HISTORY: History of smoking, quit last month, smoked 1 pack per day for 50 years. No alcohol or drug use.   FAMILY HISTORY: Positive for mom and sister with colon cancer.   REVIEW OF SYSTEMS: Currently unobtainable due to patient's confusion.   PHYSICAL EXAMINATION: VITAL SIGNS: Temperature 96.6, pulse 80,  respirations 20, blood pressure 184/84, O2 99%.  GENERAL: The patient is a thin, white female who appears disheveled and currently confused.  HEENT: Head atraumatic, normocephalic. Pupils equally round, reactive to light and accommodation. There is no conjunctival pallor. No scleral icterus. Nasal exam shows no drainage or ulceration.  Oropharynx is clear without any exudate.  NECK: No thyromegaly. No carotid bruits.  RESPIRATORY: Rhonchi in the lungs, but no wheezing. No crackles.  CARDIOVASCULAR: Regular rate and rhythm. No murmurs, rubs, clicks or gallops. PMI is not displaced.  ABDOMEN: Soft, nontender, nondistended. Positive bowel sounds x 4.  EXTREMITIES: No clubbing, cyanosis or edema.  SKIN: No rash.  LYMPHATICS: No lymph nodes palpable.  VASCULAR: Good DP, PT pulses.  PSYCHIATRIC: The patient is anxious.  NEUROLOGIC:  Spontaneously moving all extremities. Not following commands. Cranial nerves II through XII grossly intact.  LABORATORY, DIAGNOSTIC AND RADIOLOGICAL DATA: CT scan of the head shows a moderate size area of vasogenic edema in the left parietal lobe.   CBC shows glucose of 103, BUN 13, creatinine 0.80, sodium 136, potassium 3.2, chloride 103, CO2 27, calcium 9.9. LFTs are normal CPK 33, CK-MB 0.5. Troponin less than 0.02. WBC 8.0, hemoglobin 11.5, platelet count 387, INR 1.0.   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  1.  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 At this  time, will give her IV Decadron 4 mg IV q. 6 hours. She will need oncology evaluation, will need a MRI of the brain to further evaluate brain since no mass is identified. Also, the patient had a PET scan recently that did not show any brain activity.   2.  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 At this time we will continue IV Dilantin. We  will use Ativan is for any further seizures.  3.  Squamous cell cancer, with metastasis now with brain metastasis. Oncology consult, likely will need radiation oncology consult was well. We will await MRI.  CODE STATUS: I discussed with the husband and daughter. They wish her to be a full code.   TIME SPENT: 45 minutes spent on this patient.     ____________________________ Lacie Scotts. Allena Katz, MD shp:cc D: 07/10/2012 19:50:00 ET T: 07/10/2012 19:57:55 ET JOB#: [id number]  cc: Yussuf Sawyers H. Allena Katz, MD, <Dictator> Charise Carwin MD ELECTRONICALLY SIGNED 07/12/2012 20:36
